# Patient Record
Sex: Male | Born: 1980 | Race: White | Hispanic: No | Marital: Married | Smoking: Never smoker
Health system: Southern US, Community
[De-identification: ages and names within clinical notes are randomized; demographics above are authoritative.]

## PROBLEM LIST (undated history)

## (undated) DIAGNOSIS — F909 Attention-deficit hyperactivity disorder, unspecified type: Secondary | ICD-10-CM

## (undated) DIAGNOSIS — N200 Calculus of kidney: Secondary | ICD-10-CM

## (undated) DIAGNOSIS — M48061 Spinal stenosis, lumbar region without neurogenic claudication: Secondary | ICD-10-CM

## (undated) HISTORY — PX: NO PAST SURGERIES: SHX2092

## (undated) HISTORY — DX: Calculus of kidney: N20.0

## (undated) HISTORY — DX: Attention-deficit hyperactivity disorder, unspecified type: F90.9

## (undated) HISTORY — DX: Spinal stenosis, lumbar region without neurogenic claudication: M48.061

---

## 2006-05-31 ENCOUNTER — Ambulatory Visit: Admission: RE | Admit: 2006-05-31 | Discharge: 2006-07-08 | Payer: Self-pay | Admitting: Radiation Oncology

## 2007-10-01 ENCOUNTER — Ambulatory Visit (HOSPITAL_COMMUNITY): Admission: RE | Admit: 2007-10-01 | Discharge: 2007-10-01 | Payer: Self-pay | Admitting: Family Medicine

## 2009-05-15 ENCOUNTER — Emergency Department (HOSPITAL_COMMUNITY): Admission: EM | Admit: 2009-05-15 | Discharge: 2009-05-15 | Payer: Self-pay | Admitting: Emergency Medicine

## 2009-09-10 ENCOUNTER — Emergency Department (HOSPITAL_COMMUNITY): Admission: EM | Admit: 2009-09-10 | Discharge: 2009-09-10 | Payer: Self-pay | Admitting: Emergency Medicine

## 2011-03-01 LAB — URINALYSIS, ROUTINE W REFLEX MICROSCOPIC
Bilirubin Urine: NEGATIVE
Glucose, UA: NEGATIVE mg/dL
Ketones, ur: NEGATIVE mg/dL
Protein, ur: 30 mg/dL — AB
Specific Gravity, Urine: 1.023 (ref 1.005–1.030)
pH: 5 (ref 5.0–8.0)

## 2011-03-01 LAB — POCT I-STAT, CHEM 8
BUN: 9 mg/dL (ref 6–23)
Calcium, Ion: 1.16 mmol/L (ref 1.12–1.32)
HCT: 46 % (ref 39.0–52.0)
Hemoglobin: 15.6 g/dL (ref 13.0–17.0)
Potassium: 3.5 mEq/L (ref 3.5–5.1)
TCO2: 24 mmol/L (ref 0–100)

## 2011-03-01 LAB — CBC
HCT: 45.1 % (ref 39.0–52.0)
MCHC: 34.1 g/dL (ref 30.0–36.0)
MCV: 86.1 fL (ref 78.0–100.0)
Platelets: 207 10*3/uL (ref 150–400)
WBC: 14.2 10*3/uL — ABNORMAL HIGH (ref 4.0–10.5)

## 2011-03-01 LAB — URINE MICROSCOPIC-ADD ON

## 2011-03-01 LAB — DIFFERENTIAL
Monocytes Absolute: 0.7 10*3/uL (ref 0.1–1.0)
Neutro Abs: 11.7 10*3/uL — ABNORMAL HIGH (ref 1.7–7.7)
Neutrophils Relative %: 83 % — ABNORMAL HIGH (ref 43–77)

## 2011-03-05 LAB — POCT RAPID STREP A (OFFICE): Streptococcus, Group A Screen (Direct): NEGATIVE

## 2011-05-31 ENCOUNTER — Other Ambulatory Visit: Payer: Self-pay | Admitting: Gastroenterology

## 2011-06-04 ENCOUNTER — Inpatient Hospital Stay
Admission: RE | Admit: 2011-06-04 | Discharge: 2011-06-04 | Payer: Self-pay | Source: Ambulatory Visit | Attending: Gastroenterology | Admitting: Gastroenterology

## 2014-09-02 ENCOUNTER — Ambulatory Visit (HOSPITAL_COMMUNITY): Payer: Self-pay | Admitting: Psychiatry

## 2016-11-01 ENCOUNTER — Institutional Professional Consult (permissible substitution): Payer: BLUE CROSS/BLUE SHIELD | Admitting: Neurology

## 2016-11-01 ENCOUNTER — Encounter: Payer: Self-pay | Admitting: Neurology

## 2016-12-04 ENCOUNTER — Institutional Professional Consult (permissible substitution): Payer: BLUE CROSS/BLUE SHIELD | Admitting: Neurology

## 2016-12-04 ENCOUNTER — Telehealth: Payer: Self-pay

## 2016-12-04 NOTE — Telephone Encounter (Signed)
LM for patient to call back and r/s, provider out sick.

## 2017-02-25 ENCOUNTER — Emergency Department (HOSPITAL_COMMUNITY): Payer: Worker's Compensation

## 2017-02-25 ENCOUNTER — Encounter (HOSPITAL_COMMUNITY): Payer: Self-pay | Admitting: Emergency Medicine

## 2017-02-25 ENCOUNTER — Emergency Department (HOSPITAL_COMMUNITY)
Admission: EM | Admit: 2017-02-25 | Discharge: 2017-02-25 | Disposition: A | Discharge status: Home / Self Care | Payer: Worker's Compensation | Attending: Emergency Medicine | Admitting: Emergency Medicine

## 2017-02-25 DIAGNOSIS — T2162XA Corrosion of second degree of abdominal wall, initial encounter: Secondary | ICD-10-CM | POA: Diagnosis not present

## 2017-02-25 DIAGNOSIS — T22612A Corrosion of second degree of left forearm, initial encounter: Secondary | ICD-10-CM | POA: Diagnosis not present

## 2017-02-25 DIAGNOSIS — T542X1A Toxic effect of corrosive acids and acid-like substances, accidental (unintentional), initial encounter: Secondary | ICD-10-CM | POA: Insufficient documentation

## 2017-02-25 DIAGNOSIS — Y929 Unspecified place or not applicable: Secondary | ICD-10-CM | POA: Diagnosis not present

## 2017-02-25 DIAGNOSIS — T22611A Corrosion of second degree of right forearm, initial encounter: Secondary | ICD-10-CM | POA: Insufficient documentation

## 2017-02-25 DIAGNOSIS — Y9389 Activity, other specified: Secondary | ICD-10-CM | POA: Diagnosis not present

## 2017-02-25 DIAGNOSIS — T304 Corrosion of unspecified body region, unspecified degree: Secondary | ICD-10-CM

## 2017-02-25 DIAGNOSIS — F909 Attention-deficit hyperactivity disorder, unspecified type: Secondary | ICD-10-CM | POA: Diagnosis not present

## 2017-02-25 DIAGNOSIS — X58XXXA Exposure to other specified factors, initial encounter: Secondary | ICD-10-CM | POA: Diagnosis not present

## 2017-02-25 DIAGNOSIS — T2142XA Corrosion of unspecified degree of abdominal wall, initial encounter: Secondary | ICD-10-CM | POA: Diagnosis present

## 2017-02-25 DIAGNOSIS — Y990 Civilian activity done for income or pay: Secondary | ICD-10-CM | POA: Diagnosis not present

## 2017-02-25 DIAGNOSIS — T23602A Corrosion of second degree of left hand, unspecified site, initial encounter: Secondary | ICD-10-CM | POA: Insufficient documentation

## 2017-02-25 DIAGNOSIS — T2681XA Corrosions of other specified parts of right eye and adnexa, initial encounter: Secondary | ICD-10-CM | POA: Insufficient documentation

## 2017-02-25 DIAGNOSIS — T23601A Corrosion of second degree of right hand, unspecified site, initial encounter: Secondary | ICD-10-CM | POA: Diagnosis not present

## 2017-02-25 MED ORDER — ALBUTEROL SULFATE HFA 108 (90 BASE) MCG/ACT IN AERS: 1.0000 | INHALATION_SPRAY | Freq: Four times a day (QID) | RESPIRATORY_TRACT | 0 refills | Status: DC | PRN
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
## 2017-02-25 NOTE — ED Notes (Signed)
Respiratory called for oxygen therapy.
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
## 2017-02-25 NOTE — ED Notes (Signed)
Patient transported to X-ray 

## 2017-02-25 NOTE — ED Notes (Signed)
Poison control recommends ABG, chest x-ray, 100% humidified oxygen.  Beta-2 adrenergic agonist PRN for bronchospasm.

## 2017-02-25 NOTE — ED Notes (Signed)
Pt ambulated around ED on room air and stats stayed at 98 percent through the duration of the walk. Pt handled the walk well.

## 2017-05-24 ENCOUNTER — Encounter (HOSPITAL_COMMUNITY): Payer: Self-pay

## 2017-05-24 ENCOUNTER — Emergency Department (HOSPITAL_COMMUNITY)
Admission: EM | Admit: 2017-05-24 | Discharge: 2017-05-25 | Disposition: A | Discharge status: Home / Self Care | Payer: Self-pay | Attending: Emergency Medicine | Admitting: Emergency Medicine

## 2017-05-24 DIAGNOSIS — S61012A Laceration without foreign body of left thumb without damage to nail, initial encounter: Secondary | ICD-10-CM | POA: Insufficient documentation

## 2017-05-24 DIAGNOSIS — W260XXA Contact with knife, initial encounter: Secondary | ICD-10-CM | POA: Insufficient documentation

## 2017-05-24 DIAGNOSIS — Z79899 Other long term (current) drug therapy: Secondary | ICD-10-CM | POA: Insufficient documentation

## 2017-05-24 DIAGNOSIS — Y999 Unspecified external cause status: Secondary | ICD-10-CM | POA: Insufficient documentation

## 2017-05-24 DIAGNOSIS — Y93G1 Activity, food preparation and clean up: Secondary | ICD-10-CM | POA: Insufficient documentation

## 2017-05-24 DIAGNOSIS — Y929 Unspecified place or not applicable: Secondary | ICD-10-CM | POA: Insufficient documentation

## 2017-05-24 NOTE — ED Triage Notes (Signed)
Pt was making pickles tonight and cut  His L thumb with a mandolin. A&Ox4. Would wrapped in triage, but still bleeding. No blooding thinners.

## 2017-05-25 MED ORDER — CEPHALEXIN 500 MG PO CAPS: 500.0000 mg | ORAL_CAPSULE | Freq: Four times a day (QID) | ORAL | 0 refills | Status: DC

## 2017-05-25 NOTE — Discharge Instructions (Signed)
1. Medications: Tylenol or ibuprofen for pain, usual home medications 2. Treatment: ice for swelling, keep wound clean with warm soap and water and keep bandage dry, do not submerge in water for 24 hours 3. Follow Up: Return to the emergency department for increased redness, drainage of pus from the wound   WOUND CARE  Keep area clean and dry for 24 hours. remove bandage in the AM or sooner if you develop pain to the thumb  After 24 hours, wash wound gently with mild soap and warm water. Reapply a new bandage after cleaning wound, if directed.   Continue daily cleansing with soap and water until healed.  Do not apply any ointments or creams to the wound, as this may cause delayed healing. Return if you experience any of the following signs of infection: Swelling, redness, pus drainage, streaking, fever >101.0 F  Return if you experience excessive bleeding that does not stop after 15-20 minutes of constant, firm pressure.

## 2017-05-25 NOTE — ED Provider Notes (Signed)
[facility]-EMERGENCY DEPT Provider Note   CSN: [id number] Arrival date & time: 05/24/17  2228     History   Chief Complaint Chief Complaint  Patient presents with  . Laceration    HPI James Waters is a 36 y.o. male presents to the Emergency Department complaining of acute, persistent laceration to the left thumb.  Pt is left handed and was using a mandolin around 7pm when the laceration occurred.  Pt's tetanus is UTD in the last 5 years.  No immunocompromise or hx of diabetes.  Pt with associated pain at the site.  Pressure applied PTA with hemostasis.    The history is provided by the patient and medical records. No language interpreter was used.    Past Medical History:  Diagnosis Date  . ADHD    adderall  . Lumbar stenosis   . Nephrolithiasis     There are no active problems to display for this patient.   Past Surgical History:  Procedure Laterality Date  . NO PAST SURGERIES         Home Medications    Prior to Admission medications   Medication Sig Start Date End Date Taking? Authorizing Provider  albuterol (PROVENTIL HFA;VENTOLIN HFA) 108 (90 Base) MCG/ACT inhaler Inhale 1-2 puffs into the lungs every 6 (six) hours as needed for wheezing or shortness of breath. 02/25/17   Liberty HandyGibbons, Claudia J, PA-C  cephALEXin (KEFLEX) 500 MG capsule Take 1 capsule (500 mg total) by mouth 4 (four) times daily. 05/25/17   Danaija Eskridge, Dahlia ClientHannah, PA-C  naproxen sodium (ANAPROX) 220 MG tablet Take 220 mg by mouth 2 (two) times daily with a meal.    [provider]    Family History Family History  Problem Relation Age of Onset  . Breast cancer Mother   . Thyroid cancer Mother   . Asthma Sister   . COPD Sister   . Kidney Stones Sister     Social History Social History  Substance Use Topics  . Smoking status: Never Smoker  . Smokeless tobacco: Never Used  . Alcohol use No     Allergies   Oxycodone and Percocet [oxycodone-acetaminophen]   Review of  Systems Review of Systems  Constitutional: Negative for fever.  Gastrointestinal: Negative for nausea and vomiting.  Skin: Positive for wound.  Allergic/Immunologic: Negative for immunocompromised state.  Neurological: Negative for weakness and numbness.  Hematological: Does not bruise/bleed easily.  Psychiatric/Behavioral: The patient is not nervous/anxious.      Physical Exam Updated Vital Signs BP (!) 142/89 (BP Location: Left Arm)   Pulse 74   Temp 99.2 F (37.3 C) (Oral)   Resp 18   SpO2 99%   Physical Exam  Constitutional: He appears well-developed and well-nourished. No distress.  HENT:  Head: Normocephalic and atraumatic.  Eyes: Conjunctivae are normal. No scleral icterus.  Neck: Normal range of motion.  Cardiovascular: Normal rate, regular rhythm, normal heart sounds and intact distal pulses.   No murmur heard. Capillary refill < 3 sec  Pulmonary/Chest: Effort normal and breath sounds normal. No respiratory distress.  Musculoskeletal: Normal range of motion. He exhibits no edema.  FROM of the left thumb  Neurological: He is alert.  Sensation: intact to normal touch throughout the thumb  Skin: Skin is warm and dry. He is not diaphoretic.  Superficial amputation of the radial tip of the pad of the thumb without involvement of the nail  Psychiatric: He has a normal mood and affect.  Nursing note and vitals reviewed.  ED Treatments / Results   Procedures Procedures (including critical care time)  Medications Ordered in ED Medications - No data to display   Initial Impression / Assessment and Plan / ED Course  I have reviewed the triage vital signs and the nursing notes.  Pertinent labs & imaging results that were available during my care of the patient were reviewed by me and considered in my medical decision making (see chart for details).      Pressure irrigation performed. Wound explored and base of wound visualized in a bloodless field without  evidence of foreign body.  Laceration occurred < 8 hours prior to repair which was well tolerated. Tdap up to date.  Pt has no comorbidities to effect normal wound healing. Pt discharged with antibiotics; keflex.  Wound is not suturable.  Pt to follow-up for wound check in 7 days; they are to return to the ED sooner for signs of infection. Pt is hemodynamically stable with no complaints prior to dc.    Final Clinical Impressions(s) / ED Diagnoses   Final diagnoses:  Laceration of left thumb without foreign body without damage to nail, initial encounter    New Prescriptions New Prescriptions   CEPHALEXIN (KEFLEX) 500 MG CAPSULE    Take 1 capsule (500 mg total) by mouth 4 (four) times daily.     Tascha Casares, Boyd Kerbs 05/25/17 0038    Dione Booze, MD 05/25/17 [phone]

## 2018-05-12 DIAGNOSIS — M545 Low back pain: Secondary | ICD-10-CM | POA: Diagnosis not present

## 2018-05-12 DIAGNOSIS — R509 Fever, unspecified: Secondary | ICD-10-CM | POA: Diagnosis not present

## 2018-05-12 DIAGNOSIS — A084 Viral intestinal infection, unspecified: Secondary | ICD-10-CM | POA: Diagnosis not present

## 2018-10-13 DIAGNOSIS — Z0000 Encounter for general adult medical examination without abnormal findings: Secondary | ICD-10-CM | POA: Diagnosis not present

## 2018-10-13 DIAGNOSIS — R82998 Other abnormal findings in urine: Secondary | ICD-10-CM | POA: Diagnosis not present

## 2018-10-20 DIAGNOSIS — Z0000 Encounter for general adult medical examination without abnormal findings: Secondary | ICD-10-CM | POA: Diagnosis not present

## 2018-10-20 DIAGNOSIS — Z1389 Encounter for screening for other disorder: Secondary | ICD-10-CM | POA: Diagnosis not present

## 2019-02-02 DIAGNOSIS — M25511 Pain in right shoulder: Secondary | ICD-10-CM | POA: Diagnosis not present

## 2019-02-02 DIAGNOSIS — Z6832 Body mass index (BMI) 32.0-32.9, adult: Secondary | ICD-10-CM | POA: Diagnosis not present

## 2019-10-27 ENCOUNTER — Ambulatory Visit: Payer: 59 | Admitting: Adult Health

## 2019-10-27 ENCOUNTER — Encounter: Payer: Self-pay | Admitting: Adult Health

## 2019-10-27 ENCOUNTER — Ambulatory Visit (INDEPENDENT_AMBULATORY_CARE_PROVIDER_SITE_OTHER): Payer: 59 | Admitting: Adult Health

## 2019-10-27 ENCOUNTER — Other Ambulatory Visit: Payer: Self-pay

## 2019-10-27 DIAGNOSIS — F411 Generalized anxiety disorder: Secondary | ICD-10-CM

## 2019-10-27 DIAGNOSIS — F909 Attention-deficit hyperactivity disorder, unspecified type: Secondary | ICD-10-CM | POA: Diagnosis not present

## 2019-10-27 DIAGNOSIS — F331 Major depressive disorder, recurrent, moderate: Secondary | ICD-10-CM | POA: Diagnosis not present

## 2019-10-27 DIAGNOSIS — G4700 Insomnia, unspecified: Secondary | ICD-10-CM | POA: Diagnosis not present

## 2019-10-27 MED ORDER — FLUOXETINE HCL 20 MG PO CAPS: 20.0000 mg | ORAL_CAPSULE | Freq: Every day | ORAL | 2 refills | Status: DC

## 2019-10-27 NOTE — Progress Notes (Signed)
[facility] MD/PA/NP Initial Note  10/27/2019 3:51 PM James Waters  MRN:  [id number]  Chief Complaint:   HPI:   Describes mood today as "so-so". Pleasant. Flat. Mood symptoms - reports depression, anxiety, and irritability. Stating "I've been this way my whole life". Has had periods of feeling ok, those times have shortened, and not won't leave. Wife concerned. Stating "I don't know how or why I have it". Feels like he is either "sad or the world is ending". Anxious at times - sitting in truck at work and not going inside. Stating "anxiety comes in waves". Getting irritable and will "shut down". Not yelling or screaming. Worried children are starting to pick up on things - "I don't want that for them". Has thought about therapy. Denies mania. Denies substance Denies infidelity. Some history of impulsivity - not spending - more frugal. Has taken medication in the past - has not not been good at "sticking" with them. Stable interest and motivation. Taking medications as prescribed.  Energy levels lower - "don't have a lot". Active, has a regular exercise routine - on his feet most of the day. Works as a Forensic scientist.  Enjoys some usual interests and activities. Married x 14 years. Has 4 children - 46- 2 sons, 1 d 64, and 1 d 6. Children home schooled. Wife staying home with children. Spending time with family. Appetite adequate. Weight stable - 40 pound gain over past year and a half. Sleeping difficulties "for as long as I can remember". Averages 4 to 5 hours. Falls asleep easily, but is up and down during the night - chronic issues. Focus and concentration varies - "depends on the day". Has taken Adderall in the past - did not work well. Took Ritalin in high school. Completing tasks. Managing aspects of household. Work going well.  Denies SI or HI. Denies AH or VH.  Past medications: Adderall, Ritalin, Paxil, Lexapro, Klonopin, Wellbutrin, Lamictal, Lithium  Visit Diagnosis:    ICD-10-CM    1. Attention deficit hyperactivity disorder (ADHD), unspecified ADHD type  F90.9   2. Major depressive disorder, recurrent episode, moderate (HCC)  F33.1 FLUoxetine (PROZAC) 20 MG capsule  3. Generalized anxiety disorder  F41.1 FLUoxetine (PROZAC) 20 MG capsule  4. Insomnia, unspecified type  G47.00     Past Psychiatric History: Denies psychiatric hospitalizations.   Past Medical History:  Past Medical History:  Diagnosis Date  . ADHD    adderall  . Lumbar stenosis   . Nephrolithiasis     Past Surgical History:  Procedure Laterality Date  . NO PAST SURGERIES      Family Psychiatric History: No pertinent family psychiatric history.   Family History:  Family History  Problem Relation Age of Onset  . Breast cancer Mother   . Thyroid cancer Mother   . Asthma Sister   . COPD Sister   . Kidney Stones Sister     Social History:  Social History   Socioeconomic History  . Marital status: Married    Spouse name: Not on file  . Number of children: Not on file  . Years of education: Not on file  . Highest education level: Not on file  Occupational History  . Not on file  Social Needs  . Financial resource strain: Not on file  . Food insecurity    Worry: Not on file    Inability: Not on file  . Transportation needs    Medical: Not on file    Non-medical: Not on file  Tobacco Use  . Smoking status: Never Smoker  . Smokeless tobacco: Never Used  Substance and Sexual Activity  . Alcohol use: No  . Drug use: No  . Sexual activity: Not on file  Lifestyle  . Physical activity    Days per week: Not on file    Minutes per session: Not on file  . Stress: Not on file  Relationships  . Social Herbalist on phone: Not on file    Gets together: Not on file    Attends religious service: Not on file    Active member of club or organization: Not on file    Attends meetings of clubs or organizations: Not on file    Relationship status: Not on file  Other Topics  Concern  . Not on file  Social History Narrative  . Not on file    Allergies:  Allergies  Allergen Reactions  . Oxycodone Hives  . Percocet [Oxycodone-Acetaminophen] Nausea And Vomiting    Metabolic Disorder Labs: No results found for: HGBA1C, MPG No results found for: PROLACTIN No results found for: CHOL, TRIG, HDL, CHOLHDL, VLDL, LDLCALC No results found for: TSH  Therapeutic Level Labs: No results found for: LITHIUM No results found for: VALPROATE No components found for:  CBMZ  Current Medications: Current Outpatient Medications  Medication Sig Dispense Refill  . FLUoxetine (PROZAC) 20 MG capsule Take 1 capsule (20 mg total) by mouth daily. 30 capsule 2   No current facility-administered medications for this visit.     Medication Side Effects: none  Orders placed this visit:  No orders of the defined types were placed in this encounter.   Psychiatric Specialty Exam:  ROS  There were no vitals taken for this visit.There is no height or weight on file to calculate BMI.  General Appearance: Neat and Well Groomed  Eye Contact:  Good  Speech:  Clear and Coherent  Volume:  Normal  Mood:  Anxious, Depressed and Irritable  Affect:  Congruent  Thought Process:  Coherent, Goal Directed and Descriptions of Associations: Intact  Orientation:  Full (Time, Place, and Person)  Thought Content: Logical   Suicidal Thoughts:  No  Homicidal Thoughts:  No  Memory:  WNL  Judgement:  Good  Insight:  Good  Psychomotor Activity:  Normal  Concentration:  Concentration: Good  Recall:  Good  Fund of Knowledge: Good  Language: Good  Assets:  Communication Skills Desire for Improvement Financial Resources/Insurance Housing Intimacy Leisure Time Physical Health Resilience Social Support Talents/Skills Transportation Vocational/Educational  ADL's:  Intact  Cognition: WNL  Prognosis:  Good   Screenings: None  Receiving Psychotherapy: No Has talked with therapist in  the past. Marriage counseling.   Treatment Plan/Recommendations:  Plan:  1. Add Prozac 20mg  daily.   RTC 4 weeks  Patient advised to contact office with any questions, adverse effects, or acute worsening in signs and symptoms.   Aloha Gell, NP

## 2019-10-28 ENCOUNTER — Encounter: Payer: Self-pay | Admitting: Adult Health

## 2019-11-24 ENCOUNTER — Encounter: Payer: Self-pay | Admitting: Adult Health

## 2019-11-24 ENCOUNTER — Ambulatory Visit (INDEPENDENT_AMBULATORY_CARE_PROVIDER_SITE_OTHER): Payer: 59 | Admitting: Adult Health

## 2019-11-24 ENCOUNTER — Other Ambulatory Visit: Payer: Self-pay

## 2019-11-24 DIAGNOSIS — F909 Attention-deficit hyperactivity disorder, unspecified type: Secondary | ICD-10-CM | POA: Diagnosis not present

## 2019-11-24 DIAGNOSIS — G4700 Insomnia, unspecified: Secondary | ICD-10-CM | POA: Diagnosis not present

## 2019-11-24 DIAGNOSIS — F411 Generalized anxiety disorder: Secondary | ICD-10-CM

## 2019-11-24 DIAGNOSIS — F331 Major depressive disorder, recurrent, moderate: Secondary | ICD-10-CM

## 2019-11-24 DIAGNOSIS — F429 Obsessive-compulsive disorder, unspecified: Secondary | ICD-10-CM

## 2019-11-24 NOTE — Progress Notes (Signed)
James Waters [id number] 23-Dec-1980 38 y.o.  Subjective:   Patient ID:  James Waters is a 38 y.o. (DOB 1981/02/12) male.  Chief Complaint: No chief complaint on file.   HPI James Waters presents to the office today for follow-up of ADHD, MDD, GAD, and insomnia.    HPI:   Describes mood today as "ok". Pleasant. Flat. Mood symptoms - reports decreased depression, anxiety, and irritability.  Reports the "jitters" a few times a day, mostly in the evening". Has cut down on caffeine. Stating "I feel much better". Also stating "I'm happy with the benefits of the medication".  Reports wife has noticed a "difference". Stating "this is the first time "I've enjoyed Christmas in a long time". Feels like he has turned the corner. Reporting sexual side effects - "may be in my head". Stable interest and motivation. Taking medications as prescribed.  Energy levels still low. Active, does not have a regular exercise routine. Works as a Forensic scientist.  Enjoys some usual interests and activities. Married. Lives with wife of 14 years an their 4 children - 72- 2 sons, 1 d 58, and 1 d 6. Children home schooled. Went to the [employer] over Christmas holiday. Spending time with family. Appetite adequate. Weight loss 8 pounds. Sleeping difficulties "it's up and down". Trouble getting to sleep - but staying asleep. Averages 5 hours of solid sleep.  Focus and concentration improved. Completing tasks. Managing aspects of household. Work going well.  Denies SI or HI. Denies AH or VH.  Past medications: Adderall, Ritalin, Paxil, Lexapro, Klonopin, Wellbutrin, Lamictal, Lithium  Review of Systems:  Review of Systems  Musculoskeletal: Negative for gait problem.  Neurological: Negative for tremors.  Psychiatric/Behavioral:       Please refer to HPI    Medications: I have reviewed the patient's current medications.  Current Outpatient Medications  Medication Sig Dispense Refill  .  FLUoxetine (PROZAC) 20 MG capsule Take 1 capsule (20 mg total) by mouth daily. 30 capsule 2   No current facility-administered medications for this visit.    Medication Side Effects: None  Allergies:  Allergies  Allergen Reactions  . Oxycodone Hives  . Percocet [Oxycodone-Acetaminophen] Nausea And Vomiting    Past Medical History:  Diagnosis Date  . ADHD    adderall  . Lumbar stenosis   . Nephrolithiasis     Family History  Problem Relation Age of Onset  . Breast cancer Mother   . Thyroid cancer Mother   . Asthma Sister   . COPD Sister   . Kidney Stones Sister     Social History   Socioeconomic History  . Marital status: Married    Spouse name: Not on file  . Number of children: Not on file  . Years of education: Not on file  . Highest education level: Not on file  Occupational History  . Not on file  Tobacco Use  . Smoking status: Never Smoker  . Smokeless tobacco: Never Used  Substance and Sexual Activity  . Alcohol use: No  . Drug use: No  . Sexual activity: Not on file  Other Topics Concern  . Not on file  Social History Narrative  . Not on file   Social Determinants of Health   Financial Resource Strain:   . Difficulty of Paying Living Expenses: Not on file  Food Insecurity:   . Worried About Programme researcher, broadcasting/film/video in the Last Year: Not on file  . Ran Out of Food in the  Last Year: Not on file  Transportation Needs:   . Lack of Transportation (Medical): Not on file  . Lack of Transportation (Non-Medical): Not on file  Physical Activity:   . Days of Exercise per Week: Not on file  . Minutes of Exercise per Session: Not on file  Stress:   . Feeling of Stress : Not on file  Social Connections:   . Frequency of Communication with Friends and Family: Not on file  . Frequency of Social Gatherings with Friends and Family: Not on file  . Attends Religious Services: Not on file  . Active Member of Clubs or Organizations: Not on file  . Attends Tax inspectorClub or  Organization Meetings: Not on file  . Marital Status: Not on file  Intimate Partner Violence:   . Fear of Current or Ex-Partner: Not on file  . Emotionally Abused: Not on file  . Physically Abused: Not on file  . Sexually Abused: Not on file    Past Medical History, Surgical history, Social history, and Family history were reviewed and updated as appropriate.   Please see review of systems for further details on the patient's review from today.   Objective:   Physical Exam:  There were no vitals taken for this visit.  Physical Exam Constitutional:      General: He is not in acute distress.    Appearance: He is well-developed.  Musculoskeletal:        General: No deformity.  Neurological:     Mental Status: He is alert and oriented to person, place, and time.     Coordination: Coordination normal.  Psychiatric:        Attention and Perception: Attention and perception normal. He does not perceive auditory or visual hallucinations.        Mood and Affect: Mood is anxious and depressed. Affect is not labile, blunt, angry or inappropriate.        Speech: Speech normal.        Behavior: Behavior normal.        Thought Content: Thought content normal. Thought content is not paranoid or delusional. Thought content does not include homicidal or suicidal ideation. Thought content does not include homicidal or suicidal plan.        Cognition and Memory: Cognition and memory normal.        Judgment: Judgment normal.     Comments: Insight intact     Lab Review:     Component Value Date/Time   NA 141 09/10/2009 1948   K 3.5 09/10/2009 1948   CL 106 09/10/2009 1948   GLUCOSE 99 09/10/2009 1948   BUN 9 09/10/2009 1948   CREATININE 0.8 09/10/2009 1948       Component Value Date/Time   WBC 14.2 (H) 09/10/2009 1945   RBC 5.24 09/10/2009 1945   HGB 15.6 09/10/2009 1948   HCT 46.0 09/10/2009 1948   PLT 207 09/10/2009 1945   MCV 86.1 09/10/2009 1945   MCHC 34.1 09/10/2009 1945    RDW 13.2 09/10/2009 1945   LYMPHSABS 1.6 09/10/2009 1945   MONOABS 0.7 09/10/2009 1945   EOSABS 0.1 09/10/2009 1945   BASOSABS 0.0 09/10/2009 1945    No results found for: POCLITH, LITHIUM   No results found for: PHENYTOIN, PHENOBARB, VALPROATE, CBMZ   .res Assessment: Plan:    Plan:  1. Continue Prozac 20mg  daily.   Consider Wellbutrin or Adderall  RTC 4 weeks  Patient advised to contact office with any questions, adverse effects, or acute  worsening in signs and symptoms.  Diagnoses and all orders for this visit:  Attention deficit hyperactivity disorder (ADHD), unspecified ADHD type  Major depressive disorder, recurrent episode, moderate (HCC)  Generalized anxiety disorder  Insomnia, unspecified type     Please see After Visit Summary for patient specific instructions.  No future appointments.  No orders of the defined types were placed in this encounter.   -------------------------------

## 2019-12-03 ENCOUNTER — Ambulatory Visit: Payer: 59 | Attending: Internal Medicine

## 2019-12-03 DIAGNOSIS — Z20822 Contact with and (suspected) exposure to covid-19: Secondary | ICD-10-CM

## 2019-12-05 LAB — NOVEL CORONAVIRUS, NAA: SARS-CoV-2, NAA: NOT DETECTED

## 2019-12-22 ENCOUNTER — Ambulatory Visit (INDEPENDENT_AMBULATORY_CARE_PROVIDER_SITE_OTHER): Payer: 59 | Admitting: Adult Health

## 2019-12-22 ENCOUNTER — Encounter: Payer: Self-pay | Admitting: Adult Health

## 2019-12-22 ENCOUNTER — Other Ambulatory Visit: Payer: Self-pay

## 2019-12-22 VITALS — BP 144/95 | HR 83

## 2019-12-22 DIAGNOSIS — G4700 Insomnia, unspecified: Secondary | ICD-10-CM | POA: Diagnosis not present

## 2019-12-22 DIAGNOSIS — F909 Attention-deficit hyperactivity disorder, unspecified type: Secondary | ICD-10-CM

## 2019-12-22 DIAGNOSIS — F411 Generalized anxiety disorder: Secondary | ICD-10-CM

## 2019-12-22 DIAGNOSIS — F429 Obsessive-compulsive disorder, unspecified: Secondary | ICD-10-CM | POA: Diagnosis not present

## 2019-12-22 DIAGNOSIS — F331 Major depressive disorder, recurrent, moderate: Secondary | ICD-10-CM | POA: Diagnosis not present

## 2019-12-22 MED ORDER — FLUOXETINE HCL 20 MG PO CAPS: 20.0000 mg | ORAL_CAPSULE | Freq: Every day | ORAL | 1 refills | Status: DC

## 2019-12-22 MED ORDER — AMPHETAMINE-DEXTROAMPHETAMINE 20 MG PO TABS: 20.0000 mg | ORAL_TABLET | Freq: Two times a day (BID) | ORAL | 0 refills | Status: DC

## 2019-12-22 NOTE — Progress Notes (Signed)
James Waters [id number] 1981-08-16 39 y.o.  Subjective:   Patient ID:  James Waters is a 39 y.o. (DOB [birth year]) male.  Chief Complaint:  Chief Complaint  Patient presents with  . Anxiety  . Depression  . Insomnia  . ADHD  . Other    OCD    HPI James Waters presents to the office today for follow-up of ADHD, MDD, GAD, OCD, and insomnia.    Describes mood today as "ok". Pleasant. Flat. Mood symptoms - reports decreased depression, anxiety, and irritability.  Stating "I continue to see improvements". Also stating "me and my wife are seeing big differences". Still having hurdles to get over. Has moved Prozac to morning and sleep has improved. Has been trying to educate himself more on his anxiety, ADHD, and OCD.Has been talking to a therapist. Revisited why he "failed" ADD medications - excessive caffeine intake while taking stimulants. Would like to restart Adderall. Reporting sexual side effects - "may be in my head". Stable interest and motivation. Taking medications as prescribed.  Energy levels still low. Active, does not have a regular exercise routine. Works as a Social research officer, government.  Enjoys some usual interests and activities. Married. Lives with wife of 14 years an their 4 children - 55- 2 sons, 1 d 71, and 1 d 60. Wife home schools children. Spending time with family. Appetite adequate. Weight loss 8 pounds. Sleeping difficulties "it's up and down". Trouble getting to sleep - but staying asleep. Averages 5 hours of solid sleep.  Focus and concentration improved. Completing tasks. Managing aspects of household. Work going well.  Denies SI or HI. Denies AH or VH.  Past medications: Adderall, Ritalin, Paxil, Lexapro, Klonopin, Wellbutrin, Lamictal, Lithium  Review of Systems:  Review of Systems  Musculoskeletal: Negative for gait problem.  Neurological: Negative for tremors.  Psychiatric/Behavioral:       Please refer to HPI    Medications: I have reviewed  the patient's current medications.  Current Outpatient Medications  Medication Sig Dispense Refill  . amphetamine-dextroamphetamine (ADDERALL) 20 MG tablet Take 1 tablet (20 mg total) by mouth 2 (two) times daily. 60 tablet 0  . FLUoxetine (PROZAC) 20 MG capsule Take 1 capsule (20 mg total) by mouth daily. 90 capsule 1  . valACYclovir (VALTREX) 1000 MG tablet SMARTSIG:2 Tablet(s) By Mouth Every 12 Hours     No current facility-administered medications for this visit.    Medication Side Effects: None  Allergies:  Allergies  Allergen Reactions  . Oxycodone Hives  . Percocet [Oxycodone-Acetaminophen] Nausea And Vomiting    Past Medical History:  Diagnosis Date  . ADHD    adderall  . Lumbar stenosis   . Nephrolithiasis     Family History  Problem Relation Age of Onset  . Breast cancer Mother   . Thyroid cancer Mother   . Asthma Sister   . COPD Sister   . Kidney Stones Sister     Social History   Socioeconomic History  . Marital status: Married    Spouse name: Not on file  . Number of children: Not on file  . Years of education: Not on file  . Highest education level: Not on file  Occupational History  . Not on file  Tobacco Use  . Smoking status: Never Smoker  . Smokeless tobacco: Never Used  Substance and Sexual Activity  . Alcohol use: No  . Drug use: No  . Sexual activity: Not on file  Other Topics Concern  . Not on  file  Social History Narrative  . Not on file   Social Determinants of Health   Financial Resource Strain:   . Difficulty of Paying Living Expenses: Not on file  Food Insecurity:   . Worried About Programme researcher, broadcasting/film/video in the Last Year: Not on file  . Ran Out of Food in the Last Year: Not on file  Transportation Needs:   . Lack of Transportation (Medical): Not on file  . Lack of Transportation (Non-Medical): Not on file  Physical Activity:   . Days of Exercise per Week: Not on file  . Minutes of Exercise per Session: Not on file   Stress:   . Feeling of Stress : Not on file  Social Connections:   . Frequency of Communication with Friends and Family: Not on file  . Frequency of Social Gatherings with Friends and Family: Not on file  . Attends Religious Services: Not on file  . Active Member of Clubs or Organizations: Not on file  . Attends Banker Meetings: Not on file  . Marital Status: Not on file  Intimate Partner Violence:   . Fear of Current or Ex-Partner: Not on file  . Emotionally Abused: Not on file  . Physically Abused: Not on file  . Sexually Abused: Not on file    Past Medical History, Surgical history, Social history, and Family history were reviewed and updated as appropriate.   Please see review of systems for further details on the patient's review from today.   Objective:   Physical Exam:  BP (!) 144/95   Pulse 83   Physical Exam Constitutional:      General: He is not in acute distress.    Appearance: He is well-developed.  Musculoskeletal:        General: No deformity.  Neurological:     Mental Status: He is alert and oriented to person, place, and time.     Coordination: Coordination normal.  Psychiatric:        Attention and Perception: Attention and perception normal. He does not perceive auditory or visual hallucinations.        Mood and Affect: Mood normal. Mood is not anxious or depressed. Affect is not labile, blunt, angry or inappropriate.        Speech: Speech normal.        Behavior: Behavior normal.        Thought Content: Thought content normal. Thought content is not paranoid or delusional. Thought content does not include homicidal or suicidal ideation. Thought content does not include homicidal or suicidal plan.        Cognition and Memory: Cognition and memory normal.        Judgment: Judgment normal.     Comments: Insight intact     Lab Review:     Component Value Date/Time   NA 141 09/10/2009 1948   K 3.5 09/10/2009 1948   CL 106 09/10/2009  1948   GLUCOSE 99 09/10/2009 1948   BUN 9 09/10/2009 1948   CREATININE 0.8 09/10/2009 1948       Component Value Date/Time   WBC 14.2 (H) 09/10/2009 1945   RBC 5.24 09/10/2009 1945   HGB 15.6 09/10/2009 1948   HCT 46.0 09/10/2009 1948   PLT 207 09/10/2009 1945   MCV 86.1 09/10/2009 1945   MCHC 34.1 09/10/2009 1945   RDW 13.2 09/10/2009 1945   LYMPHSABS 1.6 09/10/2009 1945   MONOABS 0.7 09/10/2009 1945   EOSABS 0.1 09/10/2009 1945  BASOSABS 0.0 09/10/2009 1945    No results found for: POCLITH, LITHIUM   No results found for: PHENYTOIN, PHENOBARB, VALPROATE, CBMZ   .res Assessment: Plan:     Plan:  Continue Prozac 20mg  daily.  Add Adderall 20mg  BID  BP this am before caffeine 132/68 74. Advised to decrease caffeine use while taking the Adderall.   RTC 4 weeks  Patient advised to contact office with any questions, adverse effects, or acute worsening in signs and symptoms.  Andranik was seen today for anxiety, depression, insomnia, adhd and other.  Diagnoses and all orders for this visit:  Obsessive-compulsive disorder, unspecified type  Insomnia, unspecified type  Generalized anxiety disorder -     FLUoxetine (PROZAC) 20 MG capsule; Take 1 capsule (20 mg total) by mouth daily.  Major depressive disorder, recurrent episode, moderate (HCC) -     FLUoxetine (PROZAC) 20 MG capsule; Take 1 capsule (20 mg total) by mouth daily.  Attention deficit hyperactivity disorder (ADHD), unspecified ADHD type -     amphetamine-dextroamphetamine (ADDERALL) 20 MG tablet; Take 1 tablet (20 mg total) by mouth 2 (two) times daily.     Please see After Visit Summary for patient specific instructions.  No future appointments.  No orders of the defined types were placed in this encounter.   -------------------------------

## 2019-12-23 ENCOUNTER — Telehealth: Payer: Self-pay | Admitting: Adult Health

## 2019-12-23 ENCOUNTER — Other Ambulatory Visit: Payer: Self-pay

## 2019-12-23 DIAGNOSIS — F331 Major depressive disorder, recurrent, moderate: Secondary | ICD-10-CM

## 2019-12-23 DIAGNOSIS — F909 Attention-deficit hyperactivity disorder, unspecified type: Secondary | ICD-10-CM

## 2019-12-23 DIAGNOSIS — F411 Generalized anxiety disorder: Secondary | ICD-10-CM

## 2019-12-23 MED ORDER — AMPHETAMINE-DEXTROAMPHETAMINE 20 MG PO TABS: 20.0000 mg | ORAL_TABLET | Freq: Two times a day (BID) | ORAL | 0 refills | Status: DC

## 2019-12-23 NOTE — Telephone Encounter (Signed)
Script sent to [facility].

## 2019-12-23 NOTE — Telephone Encounter (Signed)
James Waters called to report that the [facility] where you sent his Adderall presciption does not have the medication and will not get it until sometime in February.  Do you want him to wait until they get to start the medication or can you send it somewhere else.  He mentioned sending to the [facility] on [address] here by [place], but he doesn't know if they have it or not.

## 2019-12-30 ENCOUNTER — Telehealth: Payer: Self-pay | Admitting: Adult Health

## 2019-12-30 NOTE — Telephone Encounter (Signed)
Pt having side effects from the prozac that he has been taking since the week before Thanksgiving. Pt was suppose to take adderall and prozac together but he has not been able to.

## 2019-12-31 NOTE — Telephone Encounter (Signed)
Spoke with pt and he states that he takes the Prozac and it is giving him sexual side effects. He states he wants to know if he can take the Adderall and Prozac together? He does not want anymore sexual side effects. Will the Adderall help him with these side effects as well? Please advise.

## 2019-12-31 NOTE — Telephone Encounter (Signed)
Please advise 

## 2019-12-31 NOTE — Telephone Encounter (Signed)
Pt. Made aware.

## 2019-12-31 NOTE — Telephone Encounter (Signed)
He can take both. The sexual side effects should get better, if not we can discuss other options at next appointment.

## 2020-01-18 ENCOUNTER — Other Ambulatory Visit: Payer: Self-pay

## 2020-01-18 ENCOUNTER — Telehealth: Payer: Self-pay | Admitting: Adult Health

## 2020-01-18 DIAGNOSIS — F909 Attention-deficit hyperactivity disorder, unspecified type: Secondary | ICD-10-CM

## 2020-01-18 MED ORDER — AMPHETAMINE-DEXTROAMPHETAMINE 20 MG PO TABS: 20.0000 mg | ORAL_TABLET | Freq: Two times a day (BID) | ORAL | 0 refills | Status: DC

## 2020-01-18 NOTE — Telephone Encounter (Signed)
Pt needs refill on ADDERALL 20 MG. Please send to pharmacy on file.

## 2020-01-18 NOTE — Telephone Encounter (Signed)
Last refill 12/23/2019, pended for Rene Kocher to submit

## 2020-01-20 ENCOUNTER — Ambulatory Visit: Payer: 59 | Attending: Internal Medicine

## 2020-01-20 DIAGNOSIS — Z20822 Contact with and (suspected) exposure to covid-19: Secondary | ICD-10-CM

## 2020-01-21 LAB — NOVEL CORONAVIRUS, NAA: SARS-CoV-2, NAA: NOT DETECTED

## 2020-02-06 ENCOUNTER — Ambulatory Visit: Payer: 59

## 2020-02-18 ENCOUNTER — Telehealth: Payer: Self-pay | Admitting: Adult Health

## 2020-02-18 ENCOUNTER — Other Ambulatory Visit: Payer: Self-pay

## 2020-02-18 DIAGNOSIS — F909 Attention-deficit hyperactivity disorder, unspecified type: Secondary | ICD-10-CM

## 2020-02-18 MED ORDER — AMPHETAMINE-DEXTROAMPHETAMINE 20 MG PO TABS: 20.0000 mg | ORAL_TABLET | Freq: Two times a day (BID) | ORAL | 0 refills | Status: DC

## 2020-02-18 NOTE — Telephone Encounter (Signed)
Pt would like a refill on Adderall. Please send to [facility] on [address].

## 2020-02-18 NOTE — Telephone Encounter (Signed)
Last refill 01/18/2020, last apt 11/2019 Pended for Rene Kocher to submit

## 2020-03-24 ENCOUNTER — Telehealth: Payer: Self-pay | Admitting: Student

## 2020-03-24 ENCOUNTER — Other Ambulatory Visit: Payer: Self-pay

## 2020-03-24 DIAGNOSIS — F909 Attention-deficit hyperactivity disorder, unspecified type: Secondary | ICD-10-CM

## 2020-03-24 MED ORDER — AMPHETAMINE-DEXTROAMPHETAMINE 20 MG PO TABS: 20.0000 mg | ORAL_TABLET | Freq: Two times a day (BID) | ORAL | 0 refills | Status: DC

## 2020-03-24 NOTE — Telephone Encounter (Signed)
Last refill 04/05, pended with appropriate fill date for [facility]

## 2020-03-24 NOTE — Telephone Encounter (Signed)
Pt needs refill on ADDERALL 20 MG. Please send to pharmacy on file. Adult nurse, [employer]

## 2020-04-07 ENCOUNTER — Other Ambulatory Visit: Payer: Self-pay

## 2020-04-07 ENCOUNTER — Encounter: Payer: Self-pay | Admitting: Adult Health

## 2020-04-07 ENCOUNTER — Ambulatory Visit (INDEPENDENT_AMBULATORY_CARE_PROVIDER_SITE_OTHER): Payer: 59 | Admitting: Adult Health

## 2020-04-07 DIAGNOSIS — F331 Major depressive disorder, recurrent, moderate: Secondary | ICD-10-CM | POA: Diagnosis not present

## 2020-04-07 DIAGNOSIS — F909 Attention-deficit hyperactivity disorder, unspecified type: Secondary | ICD-10-CM | POA: Diagnosis not present

## 2020-04-07 DIAGNOSIS — F411 Generalized anxiety disorder: Secondary | ICD-10-CM

## 2020-04-07 MED ORDER — AMPHETAMINE-DEXTROAMPHETAMINE 20 MG PO TABS: 20.0000 mg | ORAL_TABLET | Freq: Two times a day (BID) | ORAL | 0 refills | Status: DC

## 2020-04-07 MED ORDER — FLUOXETINE HCL 20 MG PO CAPS: 20.0000 mg | ORAL_CAPSULE | Freq: Every day | ORAL | 1 refills | Status: DC

## 2020-04-07 NOTE — Progress Notes (Signed)
James Waters [id number] 02-19-1981 39 y.o.  Subjective:   Patient ID:  James Waters is a 39 y.o. (DOB 1981-02-01) male.  Chief Complaint: No chief complaint on file.   HPI James Waters presents to the office today for follow-up of ADHD, MDD, GAD, OCD, and insomnia.    Describes mood today as "ok". Pleasant. Flat. Mood symptoms - denies depression, anxiety, and irritability.  Stating "I have good and bad days, more good than bad". Denies sexual sides - taking prozac 20mg  every other day. Recent labs indicated low testosterone. Has been taking supplements and exercising - "that is helping". Stable interest and motivation. Taking medications as prescribed.  Energy levels stable. Active, has a regular exercise routine - 3 to 5 times a week.. Works as a Social research officer, government.  Enjoys some usual interests and activities. Married. Lives with wife of 14 years an their 4 children - 36- 2 sons, 1 d 29, and 1 d 1. Wife home schools children. Spending time with family. Appetite adequate. Weight loss 19 pounds. Sleeping well most nights. Averages 7 to 8 hours. Some disruption with working out of town recently.  Focus and concentration stable. Completing tasks. Managing aspects of household. Work going well.  Denies SI or HI. Denies AH or VH.  Past medications: Adderall, Ritalin, Paxil, Lexapro, Klonopin, Wellbutrin, Lamictal, Lithium     Review of Systems:  Review of Systems  Musculoskeletal: Negative for gait problem.  Neurological: Negative for tremors.  Psychiatric/Behavioral:       Please refer to HPI    Medications: I have reviewed the patient's current medications.  Current Outpatient Medications  Medication Sig Dispense Refill  . amphetamine-dextroamphetamine (ADDERALL) 20 MG tablet Take 1 tablet (20 mg total) by mouth 2 (two) times daily. 60 tablet 0  . [START ON 05/05/2020] amphetamine-dextroamphetamine (ADDERALL) 20 MG tablet Take 1 tablet (20 mg total) by mouth 2  (two) times daily. 60 tablet 0  . [START ON 06/02/2020] amphetamine-dextroamphetamine (ADDERALL) 20 MG tablet Take 1 tablet (20 mg total) by mouth 2 (two) times daily. 60 tablet 0  . FLUoxetine (PROZAC) 20 MG capsule Take 1 capsule (20 mg total) by mouth daily. 90 capsule 1  . valACYclovir (VALTREX) 1000 MG tablet SMARTSIG:2 Tablet(s) By Mouth Every 12 Hours     No current facility-administered medications for this visit.    Medication Side Effects: None  Allergies:  Allergies  Allergen Reactions  . Oxycodone Hives  . Percocet [Oxycodone-Acetaminophen] Nausea And Vomiting    Past Medical History:  Diagnosis Date  . ADHD    adderall  . Lumbar stenosis   . Nephrolithiasis     Family History  Problem Relation Age of Onset  . Breast cancer Mother   . Thyroid cancer Mother   . Asthma Sister   . COPD Sister   . Kidney Stones Sister     Social History   Socioeconomic History  . Marital status: Married    Spouse name: Not on file  . Number of children: Not on file  . Years of education: Not on file  . Highest education level: Not on file  Occupational History  . Not on file  Tobacco Use  . Smoking status: Never Smoker  . Smokeless tobacco: Never Used  Substance and Sexual Activity  . Alcohol use: No  . Drug use: No  . Sexual activity: Not on file  Other Topics Concern  . Not on file  Social History Narrative  . Not on file  Social Determinants of Health   Financial Resource Strain:   . Difficulty of Paying Living Expenses:   Food Insecurity:   . Worried About Programme researcher, broadcasting/film/video in the Last Year:   . Barista in the Last Year:   Transportation Needs:   . Freight forwarder (Medical):   Marland Kitchen Lack of Transportation (Non-Medical):   Physical Activity:   . Days of Exercise per Week:   . Minutes of Exercise per Session:   Stress:   . Feeling of Stress :   Social Connections:   . Frequency of Communication with Friends and Family:   . Frequency of  Social Gatherings with Friends and Family:   . Attends Religious Services:   . Active Member of Clubs or Organizations:   . Attends Banker Meetings:   Marland Kitchen Marital Status:   Intimate Partner Violence:   . Fear of Current or Ex-Partner:   . Emotionally Abused:   Marland Kitchen Physically Abused:   . Sexually Abused:     Past Medical History, Surgical history, Social history, and Family history were reviewed and updated as appropriate.   Please see review of systems for further details on the patient's review from today.   Objective:   Physical Exam:  There were no vitals taken for this visit.  Physical Exam Constitutional:      General: He is not in acute distress. Musculoskeletal:        General: No deformity.  Neurological:     Mental Status: He is alert and oriented to person, place, and time.     Coordination: Coordination normal.  Psychiatric:        Attention and Perception: Attention and perception normal. He does not perceive auditory or visual hallucinations.        Mood and Affect: Mood normal. Mood is not anxious or depressed. Affect is not labile, blunt, angry or inappropriate.        Speech: Speech normal.        Behavior: Behavior normal.        Thought Content: Thought content normal. Thought content is not paranoid or delusional. Thought content does not include homicidal or suicidal ideation. Thought content does not include homicidal or suicidal plan.        Cognition and Memory: Cognition and memory normal.        Judgment: Judgment normal.     Comments: Insight intact     Lab Review:     Component Value Date/Time   NA 141 09/10/2009 1948   K 3.5 09/10/2009 1948   CL 106 09/10/2009 1948   GLUCOSE 99 09/10/2009 1948   BUN 9 09/10/2009 1948   CREATININE 0.8 09/10/2009 1948       Component Value Date/Time   WBC 14.2 (H) 09/10/2009 1945   RBC 5.24 09/10/2009 1945   HGB 15.6 09/10/2009 1948   HCT 46.0 09/10/2009 1948   PLT 207 09/10/2009 1945    MCV 86.1 09/10/2009 1945   MCHC 34.1 09/10/2009 1945   RDW 13.2 09/10/2009 1945   LYMPHSABS 1.6 09/10/2009 1945   MONOABS 0.7 09/10/2009 1945   EOSABS 0.1 09/10/2009 1945   BASOSABS 0.0 09/10/2009 1945    No results found for: POCLITH, LITHIUM   No results found for: PHENYTOIN, PHENOBARB, VALPROATE, CBMZ   .res Assessment: Plan:    Plan:  Prozac 20mg  every other day  Adderall 20mg  BID  RTC 3 months  Patient advised to contact office with any questions,  adverse effects, or acute worsening in signs and symptoms  Discussed potential benefits, risks, and side effects of stimulants with patient to include increased heart rate, palpitations, insomnia, increased anxiety, increased irritability, or decreased appetite.  Instructed patient to contact office if experiencing any significant tolerability issues.  Diagnoses and all orders for this visit:  Generalized anxiety disorder -     FLUoxetine (PROZAC) 20 MG capsule; Take 1 capsule (20 mg total) by mouth daily.  Major depressive disorder, recurrent episode, moderate (HCC) -     FLUoxetine (PROZAC) 20 MG capsule; Take 1 capsule (20 mg total) by mouth daily.  Attention deficit hyperactivity disorder (ADHD), unspecified ADHD type -     amphetamine-dextroamphetamine (ADDERALL) 20 MG tablet; Take 1 tablet (20 mg total) by mouth 2 (two) times daily. -     amphetamine-dextroamphetamine (ADDERALL) 20 MG tablet; Take 1 tablet (20 mg total) by mouth 2 (two) times daily. -     amphetamine-dextroamphetamine (ADDERALL) 20 MG tablet; Take 1 tablet (20 mg total) by mouth 2 (two) times daily.     Please see After Visit Summary for patient specific instructions.  No future appointments.  No orders of the defined types were placed in this encounter.   -------------------------------

## 2020-07-07 ENCOUNTER — Ambulatory Visit: Payer: 59 | Admitting: Adult Health

## 2020-07-15 ENCOUNTER — Other Ambulatory Visit (HOSPITAL_BASED_OUTPATIENT_CLINIC_OR_DEPARTMENT_OTHER): Payer: Self-pay | Admitting: Internal Medicine

## 2020-07-15 ENCOUNTER — Other Ambulatory Visit: Payer: Self-pay

## 2020-07-15 ENCOUNTER — Ambulatory Visit (HOSPITAL_BASED_OUTPATIENT_CLINIC_OR_DEPARTMENT_OTHER)
Admission: RE | Admit: 2020-07-15 | Discharge: 2020-07-15 | Disposition: A | Discharge status: Home / Self Care | Payer: 59 | Source: Ambulatory Visit | Attending: Internal Medicine | Admitting: Internal Medicine

## 2020-07-15 DIAGNOSIS — R319 Hematuria, unspecified: Secondary | ICD-10-CM | POA: Insufficient documentation

## 2020-07-21 ENCOUNTER — Other Ambulatory Visit: Payer: Self-pay | Admitting: Urology

## 2020-07-28 ENCOUNTER — Encounter (HOSPITAL_BASED_OUTPATIENT_CLINIC_OR_DEPARTMENT_OTHER): Admission: RE | Payer: Self-pay | Source: Home / Self Care

## 2020-07-28 ENCOUNTER — Ambulatory Visit (HOSPITAL_BASED_OUTPATIENT_CLINIC_OR_DEPARTMENT_OTHER): Admission: RE | Admit: 2020-07-28 | Payer: 59 | Source: Home / Self Care | Admitting: Urology

## 2020-07-28 SURGERY — LITHOTRIPSY, ESWL
Anesthesia: LOCAL | Laterality: Left

## 2020-09-10 ENCOUNTER — Other Ambulatory Visit: Payer: Self-pay | Admitting: Urology

## 2020-09-10 MED ORDER — TAMSULOSIN HCL 0.4 MG PO CAPS: 0.4000 mg | ORAL_CAPSULE | Freq: Every day | ORAL | 0 refills | Status: AC

## 2020-09-10 MED ORDER — KETOROLAC TROMETHAMINE 10 MG PO TABS: 10.0000 mg | ORAL_TABLET | Freq: Four times a day (QID) | ORAL | 0 refills | Status: AC | PRN

## 2020-10-19 ENCOUNTER — Emergency Department (HOSPITAL_COMMUNITY)
Admission: EM | Admit: 2020-10-19 | Discharge: 2020-10-19 | Disposition: A | Discharge status: Home / Self Care | Payer: 59 | Attending: Emergency Medicine | Admitting: Emergency Medicine

## 2020-10-19 ENCOUNTER — Emergency Department (HOSPITAL_COMMUNITY): Payer: 59

## 2020-10-19 ENCOUNTER — Encounter (HOSPITAL_COMMUNITY): Payer: Self-pay | Admitting: Emergency Medicine

## 2020-10-19 DIAGNOSIS — N200 Calculus of kidney: Secondary | ICD-10-CM | POA: Insufficient documentation

## 2020-10-19 DIAGNOSIS — N201 Calculus of ureter: Secondary | ICD-10-CM

## 2020-10-19 DIAGNOSIS — R109 Unspecified abdominal pain: Secondary | ICD-10-CM | POA: Diagnosis present

## 2020-10-19 LAB — URINALYSIS, ROUTINE W REFLEX MICROSCOPIC
Bilirubin Urine: NEGATIVE
Glucose, UA: NEGATIVE mg/dL
Ketones, ur: NEGATIVE mg/dL
Leukocytes,Ua: NEGATIVE
Nitrite: NEGATIVE
Protein, ur: 30 mg/dL — AB
RBC / HPF: >50 RBC/hpf — ABNORMAL HIGH (ref 0–5)
Specific Gravity, Urine: 1.025 (ref 1.005–1.030)
pH: 5 (ref 5.0–8.0)

## 2020-10-19 LAB — CBC
HCT: 48.4 % (ref 39.0–52.0)
Hemoglobin: 15.6 g/dL (ref 13.0–17.0)
MCH: 27.6 pg (ref 26.0–34.0)
MCHC: 32.2 g/dL (ref 30.0–36.0)
MCV: 85.5 fL (ref 80.0–100.0)
Platelets: 195 10*3/uL (ref 150–400)
RBC: 5.66 MIL/uL (ref 4.22–5.81)
RDW: 13.1 % (ref 11.5–15.5)
WBC: 8.9 10*3/uL (ref 4.0–10.5)
nRBC: 0 % (ref 0.0–0.2)

## 2020-10-19 LAB — COMPREHENSIVE METABOLIC PANEL
ALT: 30 U/L (ref 0–44)
AST: 21 U/L (ref 15–41)
Albumin: 4.1 g/dL (ref 3.5–5.0)
Alkaline Phosphatase: 48 U/L (ref 38–126)
Anion gap: 9 (ref 5–15)
BUN: 16 mg/dL (ref 6–20)
CO2: 25 mmol/L (ref 22–32)
Calcium: 8.6 mg/dL — ABNORMAL LOW (ref 8.9–10.3)
Chloride: 104 mmol/L (ref 98–111)
Creatinine, Ser: 0.92 mg/dL (ref 0.61–1.24)
GFR, Estimated: >60 mL/min (ref >60)
Glucose, Bld: 106 mg/dL — ABNORMAL HIGH (ref 70–99)
Potassium: 3.8 mmol/L (ref 3.5–5.1)
Sodium: 138 mmol/L (ref 135–145)
Total Bilirubin: 0.4 mg/dL (ref 0.3–1.2)
Total Protein: 6.6 g/dL (ref 6.5–8.1)

## 2020-10-19 LAB — LIPASE, BLOOD: Lipase: 34 U/L (ref 11–51)

## 2020-10-19 MED ORDER — ONDANSETRON HCL 4 MG/2ML IJ SOLN
4.0000 mg | Freq: Once | INTRAMUSCULAR | Status: AC
  Administered 2020-10-19: 4 mg via INTRAVENOUS
  Filled 2020-10-19: qty 2

## 2020-10-19 MED ORDER — SENNOSIDES-DOCUSATE SODIUM 8.6-50 MG PO TABS: 1.0000 | ORAL_TABLET | Freq: Every evening | ORAL | 0 refills | Status: DC | PRN

## 2020-10-19 MED ORDER — HYDROCODONE-ACETAMINOPHEN 5-325 MG PO TABS
1.0000 | ORAL_TABLET | Freq: Four times a day (QID) | ORAL | 0 refills | Status: DC | PRN
Start: 2020-10-19 — End: 2021-03-27

## 2020-10-19 MED ORDER — IBUPROFEN 800 MG PO TABS: 800.0000 mg | ORAL_TABLET | Freq: Three times a day (TID) | ORAL | 0 refills | Status: DC | PRN

## 2020-10-19 MED ORDER — TAMSULOSIN HCL 0.4 MG PO CAPS: 0.4000 mg | ORAL_CAPSULE | Freq: Every day | ORAL | 0 refills | Status: AC

## 2020-10-19 MED ORDER — ONDANSETRON HCL 4 MG PO TABS: 4.0000 mg | ORAL_TABLET | Freq: Three times a day (TID) | ORAL | 0 refills | Status: DC | PRN

## 2020-10-19 MED ORDER — KETOROLAC TROMETHAMINE 30 MG/ML IJ SOLN
30.0000 mg | Freq: Once | INTRAMUSCULAR | Status: AC
  Administered 2020-10-19: 30 mg via INTRAVENOUS
  Filled 2020-10-19: qty 1

## 2020-10-19 NOTE — Discharge Instructions (Signed)
You have been seen in the Emergency Department (ED) today for pain that we believe based on your workup, is caused by kidney stones.  As we have discussed, please drink plenty of fluids.  Please make a follow up appointment with the physician(s) listed elsewhere in this documentation.  You may take pain medication as needed but ONLY as prescribed.  Please also take your prescribed Flomax daily.  We also recommend that you take over-the-counter ibuprofen regularly according to label instructions over the next 5 days.  Take it with meals to minimize stomach discomfort.  Please see your doctor as soon as possible as stones may take 1-3 weeks to pass and you may require additional care or medications.  Do not drink alcohol, drive or participate in any other potentially dangerous activities while taking opiate pain medication as it may make you sleepy. Do not take this medication with any other sedating medications, either prescription or over-the-counter. If you were prescribed Vicodin, do not take these with acetaminophen (Tylenol) as it is already contained within these medications.   Take Vicodin as needed for severe pain.  This medication is an opiate (or narcotic) pain medication and can be habit forming.  Use it as little as possible to achieve adequate pain control.  Do not use or use it with extreme caution if you have a history of opiate abuse or dependence.  If you are on a pain contract with your primary care doctor or a pain specialist, be sure to let them know you were prescribed this medication today from the Emergency Department.  This medication is intended for your use only - do not give any to anyone else and keep it in a secure place where nobody else, especially children, have access to it.  It will also cause or worsen constipation, so you may want to consider taking an over-the-counter stool softener while you are taking this medication.  Return to the Emergency Department (ED) or call  your doctor if you have any worsening pain, fever, painful urination, are unable to urinate, or develop other symptoms that concern you.

## 2020-10-19 NOTE — ED Provider Notes (Signed)
Emergency Department Provider Note   I have reviewed the triage vital signs and the nursing notes.   HISTORY  Chief Complaint Flank Pain   HPI James Waters is a 39 y.o. male with past medical history of kidney stones presents to the emergency department with left flank pain returning over the past 24 hours.  He had had intermittent pain with hematuria and had a CT scan in August of this year showing a 5 mm nonobstructing stone on the left.  He notes prior imaging with larger stones and there was an initial discussion regarding lithotripsy.  The OR schedule was delayed and symptoms improved and so patient never underwent that procedure.  He has had kidney stones in the past that have not required intervention.  In the past 24 hours he has had worsening pain which is intermittent with no clear provoking factors.  He has had nausea and vomiting which she attributes to pain as well.  He has been trying to stay hydrated but has had difficulty with urination.  Denies any fevers or chills.  Denies known hematuria today.  No radiation of symptoms or other modifying factors. Followed by Dr. Liliane Shi with [facility].    Past Medical History:  Diagnosis Date  . ADHD    adderall  . Lumbar stenosis   . Nephrolithiasis     There are no problems to display for this patient.   Past Surgical History:  Procedure Laterality Date  . NO PAST SURGERIES      Allergies Oxycodone and Percocet [oxycodone-acetaminophen]  Family History  Problem Relation Age of Onset  . Breast cancer Mother   . Thyroid cancer Mother   . Asthma Sister   . COPD Sister   . Kidney Stones Sister     Social History Social History   Tobacco Use  . Smoking status: Never Smoker  . Smokeless tobacco: Never Used  Substance Use Topics  . Alcohol use: No  . Drug use: No    Review of Systems  Constitutional: No fever/chills Eyes: No visual changes. ENT: No sore throat. Cardiovascular: Denies chest  pain. Respiratory: Denies shortness of breath. Gastrointestinal: Positive left flank/abdominal pain. Positive nausea and vomiting.  No diarrhea.  No constipation. Genitourinary: Negative for dysuria. Positive urine hesitancy.  Musculoskeletal: Negative for back pain. Skin: Negative for rash. Neurological: Negative for headaches, focal weakness or numbness.  10-point ROS otherwise negative.  ____________________________________________   PHYSICAL EXAM:  VITAL SIGNS: ED Triage Vitals  Enc Vitals Group     BP 10/19/20 0130 (!) 164/103     Pulse Rate 10/19/20 0130 78     Resp 10/19/20 0130 18     Temp 10/19/20 0130 98.1 F (36.7 C)     Temp Source 10/19/20 0130 Oral     SpO2 10/19/20 0130 100 %     Weight 10/19/20 0130 195 lb (88.5 kg)     Height 10/19/20 0130 5\' 9"  (1.753 m)   Constitutional: Alert and oriented. Well appearing and in no acute distress. Eyes: Conjunctivae are normal.  Head: Atraumatic. Nose: No congestion/rhinnorhea. Mouth/Throat: Mucous membranes are moist.   Neck: No stridor.   Cardiovascular: Normal rate, regular rhythm. Good peripheral circulation. Grossly normal heart sounds.   Respiratory: Normal respiratory effort.  No retractions. Lungs CTAB. Gastrointestinal: Soft and nontender. No distention.  Musculoskeletal: No lower extremity tenderness nor edema. No gross deformities of extremities. Neurologic:  Normal speech and language. No gross focal neurologic deficits are appreciated.  Skin:  Skin is warm, dry and intact. No rash noted.  ____________________________________________   LABS (all labs ordered are listed, but only abnormal results are displayed)  Labs Reviewed  COMPREHENSIVE METABOLIC PANEL - Abnormal; Notable for the following components:      Result Value   Glucose, Bld 106 (*)    Calcium 8.6 (*)    All other components within normal limits  URINALYSIS, ROUTINE W REFLEX MICROSCOPIC - Abnormal; Notable for the following components:    APPearance HAZY (*)    Hgb urine dipstick LARGE (*)    Protein, ur 30 (*)    RBC / HPF >50 (*)    Bacteria, UA RARE (*)    All other components within normal limits  URINE CULTURE  LIPASE, BLOOD  CBC   ____________________________________________  RADIOLOGY  CT Renal Stone Study  Result Date: 10/19/2020 CLINICAL DATA:  Left flank pain. Previous renal stones. Patient was scheduled for lithotripsy but never had it. EXAM: CT ABDOMEN AND PELVIS WITHOUT CONTRAST TECHNIQUE: Multidetector CT imaging of the abdomen and pelvis was performed following the standard protocol without IV contrast. COMPARISON:  07/15/2020 FINDINGS: Lower chest: The lung bases are clear. Hepatobiliary: No focal liver abnormality is seen. No gallstones, gallbladder wall thickening, or biliary dilatation. Pancreas: Unremarkable. No pancreatic ductal dilatation or surrounding inflammatory changes. Spleen: Normal in size without focal abnormality. Adrenals/Urinary Tract: No adrenal gland nodules. There is a 7 mm stone in the proximal left ureter just distal to the ureteropelvic junction. Since the previous study, the stone has only moved about 2 cm inferiorly. There is proximal left renal hydronephrosis with stranding around the left kidney and ureter. The distal left ureter is decompressed. Additional nonobstructing stones demonstrated in the lower pole left kidney. Bladder is normal. Stomach/Bowel: Stomach is within normal limits. Appendix appears normal. No evidence of bowel wall thickening, distention, or inflammatory changes. Vascular/Lymphatic: No significant vascular findings are present. No enlarged abdominal or pelvic lymph nodes. Reproductive: Prostate is unremarkable. Other: No abdominal wall hernia or abnormality. No abdominopelvic ascites. Musculoskeletal: No acute or significant osseous findings. IMPRESSION: 7 mm stone in the proximal left ureter with moderate proximal obstruction. Additional nonobstructing stones in the  lower pole left kidney. Electronically Signed   By: Burman Nieves M.D.   On: 10/19/2020 04:00    ____________________________________________   PROCEDURES  Procedure(s) performed:   Procedures  None  ____________________________________________   INITIAL IMPRESSION / ASSESSMENT AND PLAN / ED COURSE  Pertinent labs & imaging results that were available during my care of the patient were reviewed by me and considered in my medical decision making (see chart for details).   Patient with history of ureteral stone presents with similar pain over the past 24 hours.  I reviewed the patient's CT imaging from August of this year.  Patient with sudden severe worsening symptoms.  Plan for repeat CT imaging to assess location and size of presumed ureteral stone.  Considered alternate diagnoses such as intra-abdominal infection/surgical process or vascular pathology but with the patient's exam and age she is lower risk for these.  Plan for CT renal, labs, UA, culture, and pain control here.  CT renal reviewed with 7 mm stone with obstruction. No UTI. Pain well controlled here. He is established with Urology. Plans to call in the AM for a follow up appointment. [place] drug database reviewed and Rx for Vicodin given. Discussed ED return precautions.  ____________________________________________  FINAL CLINICAL IMPRESSION(S) / ED DIAGNOSES  Final diagnoses:  Ureteral stone  MEDICATIONS GIVEN DURING THIS VISIT:  Medications  ketorolac (TORADOL) 30 MG/ML injection 30 mg (30 mg Intravenous Given 10/19/20 0211)  ondansetron (ZOFRAN) injection 4 mg (4 mg Intravenous Given 10/19/20 0210)     NEW OUTPATIENT MEDICATIONS STARTED DURING THIS VISIT:  Discharge Medication List as of 10/19/2020  4:27 AM    START taking these medications   Details  HYDROcodone-acetaminophen (NORCO/VICODIN) 5-325 MG tablet Take 1 tablet by mouth every 6 (six) hours as needed for severe pain., Starting Wed  10/19/2020, Normal    ibuprofen (ADVIL) 800 MG tablet Take 1 tablet (800 mg total) by mouth every 8 (eight) hours as needed for moderate pain., Starting Wed 10/19/2020, Normal    ondansetron (ZOFRAN) 4 MG tablet Take 1 tablet (4 mg total) by mouth every 8 (eight) hours as needed for nausea or vomiting., Starting Wed 10/19/2020, Normal    senna-docusate (SENOKOT-S) 8.6-50 MG tablet Take 1 tablet by mouth at bedtime as needed for mild constipation or moderate constipation., Starting Wed 10/19/2020, Normal    tamsulosin (FLOMAX) 0.4 MG CAPS capsule Take 1 capsule (0.4 mg total) by mouth daily for 14 days., Starting Wed 10/19/2020, Until Wed 11/02/2020, Normal        Note:  This document was prepared using Dragon voice recognition software and may include unintentional dictation errors.  Alona Bene, MD, [facility]    Rhyland Hinderliter, Arlyss Repress, MD 10/20/20 [phone]

## 2020-10-19 NOTE — ED Triage Notes (Signed)
Patient here from home reporting left sided flank pain. Followed by urology. Reports that he was scheduled for lithotripsy but did not have it because "I was going to be out of town". Hx of stones.

## 2020-10-20 LAB — URINE CULTURE: Culture: NO GROWTH

## 2021-03-27 ENCOUNTER — Encounter: Payer: Self-pay | Admitting: Podiatry

## 2021-03-27 ENCOUNTER — Other Ambulatory Visit: Payer: Self-pay

## 2021-03-27 ENCOUNTER — Ambulatory Visit (INDEPENDENT_AMBULATORY_CARE_PROVIDER_SITE_OTHER): Payer: 59

## 2021-03-27 ENCOUNTER — Ambulatory Visit (INDEPENDENT_AMBULATORY_CARE_PROVIDER_SITE_OTHER): Payer: 59 | Admitting: Podiatry

## 2021-03-27 DIAGNOSIS — M79671 Pain in right foot: Secondary | ICD-10-CM | POA: Diagnosis not present

## 2021-03-27 DIAGNOSIS — M722 Plantar fascial fibromatosis: Secondary | ICD-10-CM

## 2021-03-27 DIAGNOSIS — M79672 Pain in left foot: Secondary | ICD-10-CM

## 2021-03-27 MED ORDER — MELOXICAM 15 MG PO TABS: 15.0000 mg | ORAL_TABLET | Freq: Every day | ORAL | 0 refills | Status: AC

## 2021-03-27 MED ORDER — TRIAMCINOLONE ACETONIDE 10 MG/ML IJ SUSP
10.0000 mg | Freq: Once | INTRAMUSCULAR | Status: AC
  Administered 2021-03-27: 10 mg

## 2021-03-27 NOTE — Patient Instructions (Signed)
If was nice to meet you today. If you have any questions or any further concerns, please feel fee to give me a call. You can call our office at [phone] or please feel fee to send me a message through MyChart.   ----  For instructions on how to put on your Plantar Fascial Brace, please visit [email or web address]  ---     Plantar Fasciitis (Heel Spur Syndrome) with Rehab The plantar fascia is a fibrous, ligament-like, soft-tissue structure that spans the bottom of the foot. Plantar fasciitis is a condition that causes pain in the foot due to inflammation of the tissue. SYMPTOMS  Pain and tenderness on the underneath side of the foot. Pain that worsens with standing or walking. CAUSES  Plantar fasciitis is caused by irritation and injury to the plantar fascia on the underneath side of the foot. Common mechanisms of injury include: Direct trauma to bottom of the foot. Damage to a small nerve that runs under the foot where the main fascia attaches to the heel bone. Stress placed on the plantar fascia due to bone spurs. RISK INCREASES WITH:  Activities that place stress on the plantar fascia (running, jumping, pivoting, or cutting). Poor strength and flexibility. Improperly fitted shoes. Tight calf muscles. Flat feet. Failure to warm-up properly before activity. Obesity. PREVENTION Warm up and stretch properly before activity. Allow for adequate recovery between workouts. Maintain physical fitness: Strength, flexibility, and endurance. Cardiovascular fitness. Maintain a health body weight. Avoid stress on the plantar fascia. Wear properly fitted shoes, including arch supports for individuals who have flat feet.  PROGNOSIS  If treated properly, then the symptoms of plantar fasciitis usually resolve without surgery. However, occasionally surgery is necessary.  RELATED COMPLICATIONS  Recurrent symptoms that may result in a chronic condition. Problems of the lower back  that are caused by compensating for the injury, such as limping. Pain or weakness of the foot during push-off following surgery. Chronic inflammation, scarring, and partial or complete fascia tear, occurring more often from repeated injections.  TREATMENT  Treatment initially involves the use of ice and medication to help reduce pain and inflammation. The use of strengthening and stretching exercises may help reduce pain with activity, especially stretches of the Achilles tendon. These exercises may be performed at home or with a therapist. Your caregiver may recommend that you use heel cups of arch supports to help reduce stress on the plantar fascia. Occasionally, corticosteroid injections are given to reduce inflammation. If symptoms persist for greater than 6 months despite non-surgical (conservative), then surgery may be recommended.   MEDICATION  If pain medication is necessary, then nonsteroidal anti-inflammatory medications, such as aspirin and ibuprofen, or other minor pain relievers, such as acetaminophen, are often recommended. Do not take pain medication within 7 days before surgery. Prescription pain relievers may be given if deemed necessary by your caregiver. Use only as directed and only as much as you need. Corticosteroid injections may be given by your caregiver. These injections should be reserved for the most serious cases, because they may only be given a certain number of times.  HEAT AND COLD Cold treatment (icing) relieves pain and reduces inflammation. Cold treatment should be applied for 10 to 15 minutes every 2 to 3 hours for inflammation and pain and immediately after any activity that aggravates your symptoms. Use ice packs or massage the area with a piece of ice (ice massage). Heat treatment may be used prior to performing the stretching and strengthening activities prescribed by your caregiver,   physical therapist, or athletic trainer. Use a heat pack or soak the injury  in warm water.  SEEK IMMEDIATE MEDICAL CARE IF: Treatment seems to offer no benefit, or the condition worsens. Any medications produce adverse side effects.  EXERCISES- RANGE OF MOTION (ROM) AND STRETCHING EXERCISES - Plantar Fasciitis (Heel Spur Syndrome) These exercises may help you when beginning to rehabilitate your injury. Your symptoms may resolve with or without further involvement from your physician, physical therapist or athletic trainer. While completing these exercises, remember:  Restoring tissue flexibility helps normal motion to return to the joints. This allows healthier, less painful movement and activity. An effective stretch should be held for at least 30 seconds. A stretch should never be painful. You should only feel a gentle lengthening or release in the stretched tissue.  RANGE OF MOTION - Toe Extension, Flexion Sit with your right / left leg crossed over your opposite knee. Grasp your toes and gently pull them back toward the top of your foot. You should feel a stretch on the bottom of your toes and/or foot. Hold this stretch for 10 seconds. Now, gently pull your toes toward the bottom of your foot. You should feel a stretch on the top of your toes and or foot. Hold this stretch for 10 seconds. Repeat  times. Complete this stretch 3 times per day.   RANGE OF MOTION - Ankle Dorsiflexion, Active Assisted Remove shoes and sit on a chair that is preferably not on a carpeted surface. Place right / left foot under knee. Extend your opposite leg for support. Keeping your heel down, slide your right / left foot back toward the chair until you feel a stretch at your ankle or calf. If you do not feel a stretch, slide your bottom forward to the edge of the chair, while still keeping your heel down. Hold this stretch for 10 seconds. Repeat 3 times. Complete this stretch 2 times per day.   STRETCH  Gastroc, Standing Place hands on wall. Extend right / left leg, keeping the  front knee somewhat bent. Slightly point your toes inward on your back foot. Keeping your right / left heel on the floor and your knee straight, shift your weight toward the wall, not allowing your back to arch. You should feel a gentle stretch in the right / left calf. Hold this position for 10 seconds. Repeat 3 times. Complete this stretch 2 times per day.  STRETCH  Soleus, Standing Place hands on wall. Extend right / left leg, keeping the other knee somewhat bent. Slightly point your toes inward on your back foot. Keep your right / left heel on the floor, bend your back knee, and slightly shift your weight over the back leg so that you feel a gentle stretch deep in your back calf. Hold this position for 10 seconds. Repeat 3 times. Complete this stretch 2 times per day.  STRETCH  Gastrocsoleus, Standing  Note: This exercise can place a lot of stress on your foot and ankle. Please complete this exercise only if specifically instructed by your caregiver.  Place the ball of your right / left foot on a step, keeping your other foot firmly on the same step. Hold on to the wall or a rail for balance. Slowly lift your other foot, allowing your body weight to press your heel down over the edge of the step. You should feel a stretch in your right / left calf. Hold this position for 10 seconds. Repeat this exercise with a   slight bend in your right / left knee. Repeat 3 times. Complete this stretch 2 times per day.   STRENGTHENING EXERCISES - Plantar Fasciitis (Heel Spur Syndrome)  These exercises may help you when beginning to rehabilitate your injury. They may resolve your symptoms with or without further involvement from your physician, physical therapist or athletic trainer. While completing these exercises, remember:  Muscles can gain both the endurance and the strength needed for everyday activities through controlled exercises. Complete these exercises as instructed by your physician,  physical therapist or athletic trainer. Progress the resistance and repetitions only as guided.  STRENGTH - Towel Curls Sit in a chair positioned on a non-carpeted surface. Place your foot on a towel, keeping your heel on the floor. Pull the towel toward your heel by only curling your toes. Keep your heel on the floor. Repeat 3 times. Complete this exercise 2 times per day.  STRENGTH - Ankle Inversion Secure one end of a rubber exercise band/tubing to a fixed object (table, pole). Loop the other end around your foot just before your toes. Place your fists between your knees. This will focus your strengthening at your ankle. Slowly, pull your big toe up and in, making sure the band/tubing is positioned to resist the entire motion. Hold this position for 10 seconds. Have your muscles resist the band/tubing as it slowly pulls your foot back to the starting position. Repeat 3 times. Complete this exercises 2 times per day.  Document Released: 11/12/2005 Document Revised: 02/04/2012 Document Reviewed: 02/24/2009 [employer] Patient Information 2014 [employer].  

## 2021-03-28 NOTE — Progress Notes (Signed)
Subjective:   Patient ID: James Waters, male   DOB: 40 y.o.   MRN: [id number]   HPI 40 year old male presents the office today for concerns of left heel pain much worse than the right.  He states that October 2021 he was on a road trip to [place] without the Cardiz[place] and started having discomfort at the heel.  He states that he has been getting worse.  About 10 days ago he stepped on a softball day and he has sharp pain to his heel.  No injury.  No increased swelling.  No bruising.  Feels a sharp knife in the bottom of his heel.  Pain fluctuates.  No other concerns.   Review of Systems  All other systems reviewed and are negative.  Past Medical History:  Diagnosis Date  . ADHD    adderall  . Lumbar stenosis   . Nephrolithiasis     Past Surgical History:  Procedure Laterality Date  . NO PAST SURGERIES       Current Outpatient Medications:  .  meloxicam (MOBIC) 15 MG tablet, Take 1 tablet (15 mg total) by mouth daily., Disp: 30 tablet, Rfl: 0 .  ibuprofen (ADVIL) 800 MG tablet, Take 1 tablet (800 mg total) by mouth every 8 (eight) hours as needed for moderate pain., Disp: 21 tablet, Rfl: 0  Allergies  Allergen Reactions  . Oxycodone Hives  . Percocet [Oxycodone-Acetaminophen] Nausea And Vomiting        Objective:  Physical Exam  General: AAO x3, NAD  Dermatological: Skin is warm, dry and supple bilateral.  There are no open sores, no preulcerative lesions, no rash or signs of infection present.  Vascular: Dorsalis Pedis artery and Posterior Tibial artery pedal pulses are 2/4 bilateral with immedate capillary fill time. There is no pain with calf compression, swelling, warmth, erythema.   Neruologic: Grossly intact via light touch bilateral.  Negative Tinel sign.  Musculoskeletal: Tenderness to palpation along the plantar medial tubercle of the calcaneus at the insertion of plantar fascia on the left >> right foot. There is no pain along the course of the plantar  fascia within the arch of the foot. Plantar fascia appears to be intact. There is no pain with lateral compression of the calcaneus or pain with vibratory sensation. There is no pain along the course or insertion of the achilles tendon. No other areas of tenderness to bilateral lower extremities. Muscular strength 5/5 in all groups tested bilateral.  Gait: Unassisted, Nonantalgic.       Assessment:   Left heel pain, plantar fasciitis     Plan:  -Treatment options discussed including all alternatives, risks, and complications -Etiology of symptoms were discussed -X-rays obtained reviewed.  No evidence of acute fracture. -Steroid injection performed.  See procedure note below. -Plantar fascial brace dispensed -Discussed stretching, icing.  Discussed shoe modifications and orthotics.  Procedure: Injection Tendon/Ligament Discussed alternatives, risks, complications and verbal consent was obtained.  Location: LEFT plantar fascia at the glabrous junction; medial approach. Skin Prep: Alcohol Injectate: 0.5cc 0.5% marcaine plain, 0.5 cc 2% lidocaine plain and, 1 cc kenalog 10. Disposition: Patient tolerated procedure well. Injection site dressed with a band-aid.  Post-injection care was discussed and return precautions discussed.   Return in about 4 weeks (around 04/24/2021) for heel pain .  Vivi Barrack DPM

## 2021-04-27 ENCOUNTER — Ambulatory Visit: Payer: 59 | Admitting: Podiatry

## 2021-06-22 ENCOUNTER — Emergency Department (HOSPITAL_COMMUNITY): Payer: 59

## 2021-06-22 ENCOUNTER — Encounter (HOSPITAL_COMMUNITY): Payer: Self-pay

## 2021-06-22 ENCOUNTER — Other Ambulatory Visit: Payer: Self-pay

## 2021-06-22 ENCOUNTER — Emergency Department (HOSPITAL_COMMUNITY)
Admission: EM | Admit: 2021-06-22 | Discharge: 2021-06-22 | Disposition: A | Discharge status: Home / Self Care | Payer: 59 | Attending: Emergency Medicine | Admitting: Emergency Medicine

## 2021-06-22 DIAGNOSIS — R1032 Left lower quadrant pain: Secondary | ICD-10-CM | POA: Diagnosis present

## 2021-06-22 DIAGNOSIS — N201 Calculus of ureter: Secondary | ICD-10-CM | POA: Diagnosis not present

## 2021-06-22 DIAGNOSIS — R197 Diarrhea, unspecified: Secondary | ICD-10-CM | POA: Insufficient documentation

## 2021-06-22 LAB — COMPREHENSIVE METABOLIC PANEL
ALT: 40 U/L (ref 0–44)
AST: 22 U/L (ref 15–41)
Albumin: 4.8 g/dL (ref 3.5–5.0)
Alkaline Phosphatase: 72 U/L (ref 38–126)
Anion gap: 10 (ref 5–15)
BUN: 15 mg/dL (ref 6–20)
CO2: 23 mmol/L (ref 22–32)
Calcium: 9 mg/dL (ref 8.9–10.3)
Chloride: 106 mmol/L (ref 98–111)
Creatinine, Ser: 0.91 mg/dL (ref 0.61–1.24)
GFR, Estimated: >60 mL/min (ref >60)
Glucose, Bld: 98 mg/dL (ref 70–99)
Potassium: 4 mmol/L (ref 3.5–5.1)
Sodium: 139 mmol/L (ref 135–145)
Total Bilirubin: 0.7 mg/dL (ref 0.3–1.2)
Total Protein: 7.3 g/dL (ref 6.5–8.1)

## 2021-06-22 LAB — CBC
HCT: 49.7 % (ref 39.0–52.0)
Hemoglobin: 16.2 g/dL (ref 13.0–17.0)
MCH: 28 pg (ref 26.0–34.0)
MCHC: 32.6 g/dL (ref 30.0–36.0)
MCV: 86 fL (ref 80.0–100.0)
Platelets: 213 10*3/uL (ref 150–400)
RBC: 5.78 MIL/uL (ref 4.22–5.81)
RDW: 12.4 % (ref 11.5–15.5)
WBC: 8.2 10*3/uL (ref 4.0–10.5)
nRBC: 0 % (ref 0.0–0.2)

## 2021-06-22 LAB — URINALYSIS, ROUTINE W REFLEX MICROSCOPIC
Bilirubin Urine: NEGATIVE
Glucose, UA: NEGATIVE mg/dL
Ketones, ur: 5 mg/dL — AB
Leukocytes,Ua: NEGATIVE
Nitrite: NEGATIVE
Protein, ur: NEGATIVE mg/dL
Specific Gravity, Urine: 1.004 — ABNORMAL LOW (ref 1.005–1.030)
pH: 5 (ref 5.0–8.0)

## 2021-06-22 LAB — LIPASE, BLOOD: Lipase: 31 U/L (ref 11–51)

## 2021-06-22 MED ORDER — HYDROCODONE-ACETAMINOPHEN 5-325 MG PO TABS
1.0000 | ORAL_TABLET | Freq: Four times a day (QID) | ORAL | 0 refills | Status: DC | PRN
Start: 2021-06-22 — End: 2021-07-27

## 2021-06-22 MED ORDER — ONDANSETRON HCL 4 MG PO TABS: 4.0000 mg | ORAL_TABLET | Freq: Three times a day (TID) | ORAL | 0 refills | Status: DC | PRN

## 2021-06-22 MED ORDER — KETOROLAC TROMETHAMINE 30 MG/ML IJ SOLN
60.0000 mg | Freq: Once | INTRAMUSCULAR | Status: AC
  Administered 2021-06-22: 60 mg via INTRAMUSCULAR
  Filled 2021-06-22: qty 2

## 2021-06-22 NOTE — ED Notes (Signed)
Pt in CT.

## 2021-06-22 NOTE — Discharge Instructions (Addendum)
You were seen in the emergency department for left lower quadrant abdominal pain nausea vomiting.  You had labs urinalysis and a CAT scan that showed a 7 mm distal stone on the left.  This is likely the source of your symptoms.  We are prescribing some pain medicine and nausea medication.  Please continue your Flomax.  Contact urology for close follow-up.  Return to the emergency department if uncontrolled pain or any fevers.

## 2021-06-22 NOTE — ED Provider Notes (Signed)
Emergency Medicine Provider Triage Evaluation Note  James Waters , a 40 y.o. male  was evaluated in triage.  Pt complains of diarrhea, nausea, vomiting since this morning. Stabbing pain on LLQ radiates to scrotum. No isolated scrotal pain or swelling. Normal urination, no blood.No bloody diarrhea. No fevers   Review of Systems  Positive: N,v,d, LLQ Negative:   Physical Exam  BP (!) 168/106 (BP Location: Right Arm)   Pulse 76   Temp 98.1 F (36.7 C) (Oral)   Resp 16   Ht 5\' 9"  (1.753 m)   Wt 90.7 kg   SpO2 99%   BMI 29.53 kg/m  Gen:   Awake, no distress   Resp:  Normal effort  MSK:   Moves extremities without difficulty  Other:  LLQ and LUQ pain, no rebound   Medical Decision Making  Medically screening exam initiated at 12:26 PM.  Appropriate orders placed.  [name] was informed that the remainder of the evaluation will be completed by another provider, this initial triage assessment does not replace that evaluation, and the importance of remaining in the ED until their evaluation is complete.  Diverticulitis vs kidney stone vs gastroenteritis, will get CT abdomen pelvis without to evaluate for both   Sharlot Gowda, PA-C 06/22/21 1229    06/24/21, MD 06/24/21 [phone]

## 2021-06-22 NOTE — ED Triage Notes (Signed)
Patient had a sudden onset of LLQ pain, N/v/D at 0800 today. Patient reports a history of kidney stones. Patient denies any issues with urinating.

## 2021-06-22 NOTE — ED Provider Notes (Signed)
[facility]-EMERGENCY DEPT Provider Note   CSN: [id number] Arrival date & time: 06/22/21  1147     History Chief Complaint  Patient presents with   Abdominal Pain   Emesis   Diarrhea    James Waters is a 40 y.o. male.  He is here for evaluation of acute onset of stabbing left lower quadrant abdominal pain associated with nausea and 5 episodes of vomiting.  No urinary symptoms.  No pain in the back.  No fevers or chills.  Some pressure into his testicles but no real testicular pain.  He says it feels similar to kidney stone that he said in the past.  No trauma.  The history is provided by the patient.  Abdominal Pain Pain location:  LLQ Pain quality: stabbing   Pain radiates to:  Scrotum Pain severity:  Severe Onset quality:  Sudden Duration:  3 hours Timing:  Constant Progression:  Improving Chronicity:  Recurrent Context: not trauma   Relieved by:  None tried Worsened by:  Nothing Ineffective treatments:  None tried Associated symptoms: diarrhea, nausea and vomiting   Associated symptoms: no chest pain, no constipation, no cough, no dysuria, no fever, no hematuria, no shortness of breath and no sore throat   Emesis Associated symptoms: abdominal pain and diarrhea   Associated symptoms: no cough, no fever, no headaches and no sore throat   Diarrhea Associated symptoms: abdominal pain and vomiting   Associated symptoms: no fever and no headaches       Past Medical History:  Diagnosis Date   ADHD    adderall   Lumbar stenosis    Nephrolithiasis     There are no problems to display for this patient.   Past Surgical History:  Procedure Laterality Date   NO PAST SURGERIES         Family History  Problem Relation Age of Onset   Breast cancer Mother    Thyroid cancer Mother    Asthma Sister    COPD Sister    Kidney Stones Sister     Social History   Tobacco Use   Smoking status: Never   Smokeless tobacco: Never  Vaping  Use   Vaping Use: Never used  Substance Use Topics   Alcohol use: No   Drug use: No    Home Medications Prior to Admission medications   Medication Sig Start Date End Date Taking? Authorizing Provider  ibuprofen (ADVIL) 800 MG tablet Take 1 tablet (800 mg total) by mouth every 8 (eight) hours as needed for moderate pain. 10/19/20   Long, Arlyss Repress, MD  meloxicam (MOBIC) 15 MG tablet Take 1 tablet (15 mg total) by mouth daily. 03/27/21 03/27/22  Vivi Barrack, DPM    Allergies    Oxycodone and Percocet [oxycodone-acetaminophen]  Review of Systems   Review of Systems  Constitutional:  Negative for fever.  HENT:  Negative for sore throat.   Eyes:  Negative for visual disturbance.  Respiratory:  Negative for cough and shortness of breath.   Cardiovascular:  Negative for chest pain.  Gastrointestinal:  Positive for abdominal pain, diarrhea, nausea and vomiting. Negative for constipation.  Genitourinary:  Negative for dysuria and hematuria.  Musculoskeletal:  Negative for back pain.  Skin:  Negative for rash.  Neurological:  Negative for headaches.   Physical Exam Updated Vital Signs BP (!) 168/106 (BP Location: Right Arm)   Pulse 76   Temp 98.1 F (36.7 C) (Oral)   Resp 16  Ht 5\' 9"  (1.753 m)   Wt 90.7 kg   SpO2 99%   BMI 29.53 kg/m   Physical Exam Vitals and nursing note reviewed.  Constitutional:      Appearance: Normal appearance. He is well-developed.  HENT:     Head: Normocephalic and atraumatic.  Eyes:     Conjunctiva/sclera: Conjunctivae normal.  Cardiovascular:     Rate and Rhythm: Normal rate and regular rhythm.     Heart sounds: No murmur heard. Pulmonary:     Effort: Pulmonary effort is normal. No respiratory distress.     Breath sounds: Normal breath sounds.  Abdominal:     General: Abdomen is flat.     Palpations: Abdomen is soft.     Tenderness: There is no abdominal tenderness. There is no guarding or rebound.  Musculoskeletal:        General:  No deformity or signs of injury. Normal range of motion.     Cervical back: Neck supple.  Skin:    General: Skin is warm and dry.     Capillary Refill: Capillary refill takes less than 2 seconds.  Neurological:     General: No focal deficit present.     Mental Status: He is alert.    ED Results / Procedures / Treatments   Labs (all labs ordered are listed, but only abnormal results are displayed) Labs Reviewed  URINALYSIS, ROUTINE W REFLEX MICROSCOPIC - Abnormal; Notable for the following components:      Result Value   Color, Urine STRAW (*)    Specific Gravity, Urine 1.004 (*)    Hgb urine dipstick MODERATE (*)    Ketones, ur 5 (*)    Bacteria, UA RARE (*)    All other components within normal limits  LIPASE, BLOOD  COMPREHENSIVE METABOLIC PANEL  CBC    EKG None  Radiology CT Abdomen Pelvis Wo Contrast  Result Date: 06/22/2021 CLINICAL DATA:  40 year old male with flank pain. EXAM: CT ABDOMEN AND PELVIS WITHOUT CONTRAST TECHNIQUE: Multidetector CT imaging of the abdomen and pelvis was performed following the standard protocol without IV contrast. CONTRAST:  None COMPARISON:  CT and pelvis, 10/19/2020. FINDINGS: Lower chest: Lung bases are clear Hepatobiliary: No focal liver abnormality is seen. No gallstones, gallbladder wall thickening, or biliary dilatation. Pancreas: Unremarkable. No pancreatic ductal dilatation or surrounding inflammatory changes. Spleen: Normal in size without focal abnormality. Adrenals/Urinary Tract: Adrenal glands are unremarkable. The right kidney is normal, without renal calculi, focal lesion, or hydronephrosis. Bladder is unremarkable. Trace left renal stranding. Punctate radiodense renal calculus within the left inferior renal pole collecting system. Mild left hydronephrosis. Trace left ureteral stranding. Partially-obstructing, 7 mm calculus at the left distal ureter - noting interval migration from the proximal ureter on comparison imaging  Stomach/Bowel: Stomach is within normal limits. Appendix appears normal. No evidence of bowel wall thickening, distention, or inflammatory changes. Vascular/Lymphatic: No significant vascular findings are present. No enlarged abdominal or pelvic lymph nodes. Reproductive: Vasectomies. Prostatic calcifications, greater than expected in a patient of this age. Other: Small fat-containing inguinal hernias. No abdominopelvic ascites. Musculoskeletal: No acute or significant osseous findings. IMPRESSION: 1. Partially-obstructing, 7 mm calculus at the left distal ureter with associated mild left hydronephrosis. 2. Punctate nonobstructing stone within the left inferior renal pole, unchanged. Electronically Signed   By: 10/21/2020 MD   On: 06/22/2021 14:23    Procedures Procedures   Medications Ordered in ED Medications  ketorolac (TORADOL) 30 MG/ML injection 60 mg (60 mg Intramuscular Given 06/22/21  1447)    ED Course  I have reviewed the triage vital signs and the nursing notes.  Pertinent labs & imaging results that were available during my care of the patient were reviewed by me and considered in my medical decision making (see chart for details).  Clinical Course as of 06/22/21 1745  Thu Jun 22, 2021  1437 CT showing 7 mm distal left ureteral stone with mild hydro-.  Reviewed this with patient.  He is comfortable going home on some pain medicine and he follows with Dr. Liliane Shi in [place].  He has some Flomax.  We will give him an IM dose of some Toradol before he leaves as he looks fairly comfortable. [MB]    Clinical Course User Index [MB] Terrilee Files, MD   MDM Rules/Calculators/A&P                          This patient complains of acute stabbing left lower quadrant abdominal pain with nausea and vomiting; this involves an extensive number of treatment Options and is a complaint that carries with it a high risk of complications and Morbidity. The differential includes incarcerated  hernia, obstruction, diverticulitis, perforation, renal colic  I ordered, reviewed and interpreted labs, which included CBC with normal white count normal hemoglobin, chemistries and LFTs normal, urinalysis without signs of infection I ordered medication IM Toradol I ordered imaging studies which included CT renal and I independently    visualized and interpreted imaging which showed 7 mm distal stone on left with some mild hydronephrosis  Previous records obtained and reviewed in epic, patient has been seen before for renal colic  After the interventions stated above, I reevaluated the patient and found patient feels his pain is controlled and is comfortable plan for outpatient discharge pain control and outpatient follow-up with urology.  Return instructions discussed   Final Clinical Impression(s) / ED Diagnoses Final diagnoses:  Ureterolithiasis    Rx / DC Orders ED Discharge Orders          Ordered    HYDROcodone-acetaminophen (NORCO/VICODIN) 5-325 MG tablet  Every 6 hours PRN        06/22/21 1439    ondansetron (ZOFRAN) 4 MG tablet  Every 8 hours PRN        06/22/21 1439             Terrilee Files, MD 06/22/21 1747

## 2021-07-27 ENCOUNTER — Other Ambulatory Visit: Payer: Self-pay

## 2021-07-27 ENCOUNTER — Emergency Department (HOSPITAL_COMMUNITY)
Admission: EM | Admit: 2021-07-27 | Discharge: 2021-07-27 | Disposition: A | Discharge status: Home / Self Care | Payer: 59 | Attending: Emergency Medicine | Admitting: Emergency Medicine

## 2021-07-27 ENCOUNTER — Emergency Department (HOSPITAL_COMMUNITY): Payer: 59

## 2021-07-27 ENCOUNTER — Encounter (HOSPITAL_COMMUNITY): Payer: Self-pay | Admitting: Emergency Medicine

## 2021-07-27 DIAGNOSIS — N200 Calculus of kidney: Secondary | ICD-10-CM | POA: Diagnosis not present

## 2021-07-27 DIAGNOSIS — Z20822 Contact with and (suspected) exposure to covid-19: Secondary | ICD-10-CM | POA: Insufficient documentation

## 2021-07-27 DIAGNOSIS — R109 Unspecified abdominal pain: Secondary | ICD-10-CM | POA: Diagnosis present

## 2021-07-27 LAB — CBC WITH DIFFERENTIAL/PLATELET
Abs Immature Granulocytes: 0.02 10*3/uL (ref 0.00–0.07)
Basophils Absolute: 0 10*3/uL (ref 0.0–0.1)
Basophils Relative: 1 %
Eosinophils Absolute: 0.1 10*3/uL (ref 0.0–0.5)
Eosinophils Relative: 2 %
HCT: 46.7 % (ref 39.0–52.0)
Hemoglobin: 15.2 g/dL (ref 13.0–17.0)
Immature Granulocytes: 0 %
Lymphocytes Relative: 29 %
Lymphs Abs: 2 10*3/uL (ref 0.7–4.0)
MCH: 28.1 pg (ref 26.0–34.0)
MCHC: 32.5 g/dL (ref 30.0–36.0)
MCV: 86.5 fL (ref 80.0–100.0)
Monocytes Absolute: 0.5 10*3/uL (ref 0.1–1.0)
Monocytes Relative: 7 %
Neutro Abs: 4.2 10*3/uL (ref 1.7–7.7)
Neutrophils Relative %: 61 %
Platelets: 229 10*3/uL (ref 150–400)
RBC: 5.4 MIL/uL (ref 4.22–5.81)
RDW: 12.7 % (ref 11.5–15.5)
WBC: 6.9 10*3/uL (ref 4.0–10.5)
nRBC: 0 % (ref 0.0–0.2)

## 2021-07-27 LAB — RESP PANEL BY RT-PCR (FLU A&B, COVID) ARPGX2
Influenza A by PCR: NEGATIVE
Influenza B by PCR: NEGATIVE
SARS Coronavirus 2 by RT PCR: NEGATIVE

## 2021-07-27 LAB — COMPREHENSIVE METABOLIC PANEL
ALT: 36 U/L (ref 0–44)
AST: 23 U/L (ref 15–41)
Albumin: 4.2 g/dL (ref 3.5–5.0)
Alkaline Phosphatase: 70 U/L (ref 38–126)
Anion gap: 5 (ref 5–15)
BUN: 13 mg/dL (ref 6–20)
CO2: 26 mmol/L (ref 22–32)
Calcium: 8.7 mg/dL — ABNORMAL LOW (ref 8.9–10.3)
Chloride: 107 mmol/L (ref 98–111)
Creatinine, Ser: 0.94 mg/dL (ref 0.61–1.24)
GFR, Estimated: >60 mL/min (ref >60)
Glucose, Bld: 100 mg/dL — ABNORMAL HIGH (ref 70–99)
Potassium: 3.6 mmol/L (ref 3.5–5.1)
Sodium: 138 mmol/L (ref 135–145)
Total Bilirubin: 0.8 mg/dL (ref 0.3–1.2)
Total Protein: 7 g/dL (ref 6.5–8.1)

## 2021-07-27 LAB — URINALYSIS, ROUTINE W REFLEX MICROSCOPIC
Bacteria, UA: NONE SEEN
Bilirubin Urine: NEGATIVE
Glucose, UA: NEGATIVE mg/dL
Ketones, ur: 40 mg/dL — AB
Leukocytes,Ua: NEGATIVE
Nitrite: NEGATIVE
Protein, ur: NEGATIVE mg/dL
Specific Gravity, Urine: 1.025 (ref 1.005–1.030)
pH: 6 (ref 5.0–8.0)

## 2021-07-27 LAB — LIPASE, BLOOD: Lipase: 29 U/L (ref 11–51)

## 2021-07-27 MED ORDER — ONDANSETRON HCL 4 MG/2ML IJ SOLN
4.0000 mg | Freq: Once | INTRAMUSCULAR | Status: AC
  Administered 2021-07-27: 4 mg via INTRAVENOUS
  Filled 2021-07-27: qty 2

## 2021-07-27 MED ORDER — FENTANYL CITRATE PF 50 MCG/ML IJ SOSY
50.0000 ug | PREFILLED_SYRINGE | Freq: Once | INTRAMUSCULAR | Status: AC
  Administered 2021-07-27: 50 ug via INTRAVENOUS
  Filled 2021-07-27: qty 1

## 2021-07-27 MED ORDER — SODIUM CHLORIDE 0.9 % IV BOLUS
500.0000 mL | Freq: Once | INTRAVENOUS | Status: AC
  Administered 2021-07-27: 500 mL via INTRAVENOUS

## 2021-07-27 MED ORDER — MORPHINE SULFATE (PF) 4 MG/ML IV SOLN
4.0000 mg | Freq: Once | INTRAVENOUS | Status: DC
  Filled 2021-07-27: qty 1

## 2021-07-27 MED ORDER — KETOROLAC TROMETHAMINE 15 MG/ML IJ SOLN
15.0000 mg | Freq: Once | INTRAMUSCULAR | Status: AC
  Administered 2021-07-27: 15 mg via INTRAVENOUS
  Filled 2021-07-27: qty 1

## 2021-07-27 MED ORDER — ONDANSETRON HCL 4 MG PO TABS: 4.0000 mg | ORAL_TABLET | Freq: Three times a day (TID) | ORAL | 0 refills | Status: AC | PRN

## 2021-07-27 MED ORDER — HYDROCODONE-ACETAMINOPHEN 5-325 MG PO TABS: 1.0000 | ORAL_TABLET | Freq: Four times a day (QID) | ORAL | 0 refills | Status: DC | PRN

## 2021-07-27 MED ORDER — HYDROCODONE-ACETAMINOPHEN 5-325 MG PO TABS
1.0000 | ORAL_TABLET | Freq: Once | ORAL | Status: AC
Start: 2021-07-27 — End: 2021-07-27
  Administered 2021-07-27: 1 via ORAL
  Filled 2021-07-27 (×2): qty 1

## 2021-07-27 MED ORDER — SODIUM CHLORIDE 0.9 % IV BOLUS
1000.0000 mL | Freq: Once | INTRAVENOUS | Status: AC
  Administered 2021-07-27: 1000 mL via INTRAVENOUS

## 2021-07-27 NOTE — ED Notes (Signed)
Patient is gone for testing. Unable to update vitals. 

## 2021-07-27 NOTE — ED Notes (Signed)
D/c paerwork reviewed with pt, including prescriptions.  Pt verbalized understanding, ambulatory to ED exit to meet ride.

## 2021-07-27 NOTE — Discharge Instructions (Addendum)
No evidence of urine infection today.  Please return if you develop any fever or worsening pain, nausea, vomiting.  Follow-up with urology.

## 2021-07-27 NOTE — ED Triage Notes (Signed)
PT c/o L flank pain since 0100. Hx kidney stone in July. States pain mimics previous stone. Last urine output 0100 despite drinking 60oz of water since that time. Endorses N/V.

## 2021-07-27 NOTE — ED Provider Notes (Signed)
[facility]-EMERGENCY DEPT Provider Note   CSN: [id number] Arrival date & time: 07/27/21  0850     History Chief Complaint  Patient presents with   Flank Pain    James Waters is a 40 y.o. male.  Left flank pain since last night.  History of kidney stones.  Denies any hematuria.  The history is provided by the patient.  Flank Pain This is a new problem. The current episode started 6 to 12 hours ago. The problem occurs constantly. The problem has not changed since onset.Pertinent negatives include no chest pain, no abdominal pain, no headaches and no shortness of breath. Nothing aggravates the symptoms. Nothing relieves the symptoms. He has tried nothing for the symptoms. The treatment provided no relief.      Past Medical History:  Diagnosis Date   ADHD    adderall   Lumbar stenosis    Nephrolithiasis     There are no problems to display for this patient.   Past Surgical History:  Procedure Laterality Date   NO PAST SURGERIES         Family History  Problem Relation Age of Onset   Breast cancer Mother    Thyroid cancer Mother    Asthma Sister    COPD Sister    Kidney Stones Sister     Social History   Tobacco Use   Smoking status: Never   Smokeless tobacco: Never  Vaping Use   Vaping Use: Never used  Substance Use Topics   Alcohol use: No   Drug use: No    Home Medications Prior to Admission medications   Medication Sig Start Date End Date Taking? Authorizing Provider  HYDROcodone-acetaminophen (NORCO/VICODIN) 5-325 MG tablet Take 1 tablet by mouth every 6 (six) hours as needed for severe pain. 07/27/21   Unique Sillas, DO  meloxicam (MOBIC) 15 MG tablet Take 1 tablet (15 mg total) by mouth daily. Patient not taking: Reported on 07/27/2021 03/27/21 03/27/22  Vivi Barrack, DPM  ondansetron (ZOFRAN) 4 MG tablet Take 1 tablet (4 mg total) by mouth every 8 (eight) hours as needed for nausea or vomiting. 07/27/21   Virgina Norfolk,  DO    Allergies    Oxycodone and Percocet [oxycodone-acetaminophen]  Review of Systems   Review of Systems  Constitutional:  Negative for chills and fever.  HENT:  Negative for ear pain and sore throat.   Eyes:  Negative for pain and visual disturbance.  Respiratory:  Negative for cough and shortness of breath.   Cardiovascular:  Negative for chest pain and palpitations.  Gastrointestinal:  Negative for abdominal pain and vomiting.  Genitourinary:  Positive for flank pain. Negative for decreased urine volume, difficulty urinating, dysuria, hematuria and testicular pain.  Musculoskeletal:  Negative for arthralgias and back pain.  Skin:  Negative for color change and rash.  Neurological:  Negative for seizures, syncope and headaches.  All other systems reviewed and are negative.  Physical Exam Updated Vital Signs BP 126/83 (BP Location: Left Arm)   Pulse 92   Temp 98.7 F (37.1 C) (Oral)   Resp 18   SpO2 97%   Physical Exam Vitals and nursing note reviewed.  Constitutional:      General: He is in acute distress.     Appearance: He is well-developed. He is not ill-appearing.  HENT:     Head: Normocephalic and atraumatic.  Eyes:     Extraocular Movements: Extraocular movements intact.     Conjunctiva/sclera: Conjunctivae normal.  Pupils: Pupils are equal, round, and reactive to light.  Cardiovascular:     Rate and Rhythm: Normal rate and regular rhythm.     Pulses: Normal pulses.     Heart sounds: Normal heart sounds. No murmur heard. Pulmonary:     Effort: Pulmonary effort is normal. No respiratory distress.     Breath sounds: Normal breath sounds.  Abdominal:     Palpations: Abdomen is soft.     Tenderness: There is no abdominal tenderness. There is left CVA tenderness.  Musculoskeletal:        General: Normal range of motion.     Cervical back: Neck supple.  Skin:    General: Skin is warm and dry.     Capillary Refill: Capillary refill takes less than 2  seconds.  Neurological:     Mental Status: He is alert.  Psychiatric:        Mood and Affect: Mood normal.    ED Results / Procedures / Treatments   Labs (all labs ordered are listed, but only abnormal results are displayed) Labs Reviewed  COMPREHENSIVE METABOLIC PANEL - Abnormal; Notable for the following components:      Result Value   Glucose, Bld 100 (*)    Calcium 8.7 (*)    All other components within normal limits  URINALYSIS, ROUTINE W REFLEX MICROSCOPIC - Abnormal; Notable for the following components:   Color, Urine YELLOW (*)    APPearance CLEAR (*)    Hgb urine dipstick LARGE (*)    Ketones, ur 40 (*)    All other components within normal limits  RESP PANEL BY RT-PCR (FLU A&B, COVID) ARPGX2  CBC WITH DIFFERENTIAL/PLATELET  LIPASE, BLOOD    EKG None  Radiology CT Renal Stone Study  Result Date: 07/27/2021 CLINICAL DATA:  Left flank pain EXAM: CT ABDOMEN AND PELVIS WITHOUT CONTRAST TECHNIQUE: Multidetector CT imaging of the abdomen and pelvis was performed following the standard protocol without IV contrast. COMPARISON:  CT abdomen/pelvis 06/22/2021 FINDINGS: Lower chest: Linear opacities in the left base and lingula likely reflects scar. The lung bases are otherwise clear. The imaged heart is unremarkable. Hepatobiliary: The liver and gallbladder are unremarkable. Pancreas: Unremarkable. Spleen: Unremarkable Adrenals/Urinary Tract: The adrenals are unremarkable. There is a 7 mm stone in the distal left ureter with mild upstream hydroureteronephrosis, unchanged since 06/22/2021. There is mild periureteral and perinephric stranding on the left, also similar to the prior study. A 3 mm nonobstructing left lower pole renal stone is also unchanged. There are no stones in the right kidney or along the course of the right ureter. No focal lesions are seen in either kidney. The bladder is unremarkable. Stomach/Bowel: The stomach is unremarkable. There is no evidence of bowel  obstruction. The appendix is unremarkable. Vascular/Lymphatic: The abdominal aorta is nonaneurysmal. There is no abdominal or pelvic lymphadenopathy. Reproductive: The prostate and seminal vesicles are stable, with unchanged prostatic calcifications. Other: A fat containing left inguinal hernia is unchanged. There is no ascites or free air. Musculoskeletal: There is no acute osseous abnormality or aggressive osseous lesion. IMPRESSION: Since 06/22/2021: 1. Unchanged partially obstructing 7 mm stone in the distal left ureter with mild upstream hydroureteronephrosis and periureteral/perinephric stranding. 2. Unchanged 3 mm nonobstructing left lower pole renal calculus. Electronically Signed   By: Lesia Hausen M.D.   On: 07/27/2021 10:35    Procedures Procedures   Medications Ordered in ED Medications  sodium chloride 0.9 % bolus 1,000 mL (1,000 mLs Intravenous New Bag/Given 07/27/21 0912)  ondansetron [facility]) injection 4 mg (4 mg Intravenous Given 07/27/21 0913)  fentaNYL (SUBLIMAZE) injection 50 mcg (50 mcg Intravenous Given 07/27/21 1013)  sodium chloride 0.9 % bolus 500 mL (500 mLs Intravenous New Bag/Given 07/27/21 1112)  ketorolac (TORADOL) 15 MG/ML injection 15 mg (15 mg Intravenous Given 07/27/21 1113)  fentaNYL (SUBLIMAZE) injection 50 mcg (50 mcg Intravenous Given 07/27/21 1113)  HYDROcodone-acetaminophen (NORCO/VICODIN) 5-325 MG per tablet 1 tablet (1 tablet Oral Given 07/27/21 1332)    ED Course  I have reviewed the triage vital signs and the nursing notes.  Pertinent labs & imaging results that were available during my care of the patient were reviewed by me and considered in my medical decision making (see chart for details).    MDM Rules/Calculators/A&P                           James Waters is a 40 year old male with history of kidney stones who presents to the ED with left flank pain.  Normal vitals.  No fever.  Pain in the left flank.  Seems to be consistent with prior kidney  stones.  Could be diverticulitis or UTI or pyelonephritis.  We will get basic labs including urinalysis and CT scan.  No testicular pain.  2:07 PM no significant white count, no significant kidney injury.  CT scan shows unchanged partially obstructed left-sided kidney stone, 7 mm.  Still having discomfort we will give another round of pain medications with Toradol and fentanyl.  Talked with Dr. Cardell Peach with urology given his ongoing pain and unchanged kidney stone that has been there for about a month.  If patient not feeling improved after round of pain medication will take to the OR for stent.  Will reevaluate patient.  Still awaiting urinalysis.  2:07 PM  Urinalysis negative for infection.  Pain has improved.  We will follow-up with urology outpatient.  Understands return precautions.  Pain medicine and nausea medicine sent to pharmacy.  This chart was dictated using voice recognition software.  Despite best efforts to proofread,  errors can occur which can change the documentation meaning.    Final Clinical Impression(s) / ED Diagnoses Final diagnoses:  Kidney stone    Rx / DC Orders ED Discharge Orders          Ordered    HYDROcodone-acetaminophen (NORCO/VICODIN) 5-325 MG tablet  Every 6 hours PRN,   Status:  Discontinued        07/27/21 1401    ondansetron (ZOFRAN) 4 MG tablet  Every 8 hours PRN        07/27/21 1401    HYDROcodone-acetaminophen (NORCO/VICODIN) 5-325 MG tablet  Every 6 hours PRN        07/27/21 1402             Joy Reiger, DO 07/27/21 1407

## 2021-08-02 ENCOUNTER — Other Ambulatory Visit: Payer: Self-pay | Admitting: Urology

## 2021-08-02 DIAGNOSIS — N201 Calculus of ureter: Secondary | ICD-10-CM

## 2021-08-08 NOTE — Progress Notes (Signed)
Patient to arrive at 0800 on 08/10/21. History and medications reviewed. Pre-procedure instructions given. NPO after MN except for clear liquids until 0600. Driver secured.

## 2021-08-10 ENCOUNTER — Ambulatory Visit (HOSPITAL_COMMUNITY): Payer: 59

## 2021-08-10 ENCOUNTER — Encounter (HOSPITAL_BASED_OUTPATIENT_CLINIC_OR_DEPARTMENT_OTHER): Payer: Self-pay | Admitting: Urology

## 2021-08-10 ENCOUNTER — Other Ambulatory Visit: Payer: Self-pay

## 2021-08-10 ENCOUNTER — Encounter (HOSPITAL_BASED_OUTPATIENT_CLINIC_OR_DEPARTMENT_OTHER)
Admission: RE | Disposition: A | Discharge status: Home / Self Care | Payer: Self-pay | Source: Ambulatory Visit | Attending: Urology

## 2021-08-10 ENCOUNTER — Ambulatory Visit (HOSPITAL_BASED_OUTPATIENT_CLINIC_OR_DEPARTMENT_OTHER)
Admission: RE | Admit: 2021-08-10 | Discharge: 2021-08-10 | Disposition: A | Discharge status: Home / Self Care | Payer: 59 | Source: Ambulatory Visit | Attending: Urology | Admitting: Urology

## 2021-08-10 DIAGNOSIS — N201 Calculus of ureter: Secondary | ICD-10-CM | POA: Insufficient documentation

## 2021-08-10 HISTORY — PX: EXTRACORPOREAL SHOCK WAVE LITHOTRIPSY: SHX1557

## 2021-08-10 SURGERY — LITHOTRIPSY, ESWL
Anesthesia: LOCAL | Laterality: Left

## 2021-08-10 MED ORDER — DIPHENHYDRAMINE HCL 25 MG PO CAPS
25.0000 mg | ORAL_CAPSULE | ORAL | Status: AC
  Administered 2021-08-10: 25 mg via ORAL

## 2021-08-10 MED ORDER — DIPHENHYDRAMINE HCL 25 MG PO CAPS
ORAL_CAPSULE | ORAL | Status: AC
  Filled 2021-08-10: qty 1

## 2021-08-10 MED ORDER — DIAZEPAM 5 MG PO TABS
10.0000 mg | ORAL_TABLET | ORAL | Status: AC
  Administered 2021-08-10: 10 mg via ORAL

## 2021-08-10 MED ORDER — CIPROFLOXACIN HCL 500 MG PO TABS
500.0000 mg | ORAL_TABLET | ORAL | Status: AC
  Administered 2021-08-10: 500 mg via ORAL

## 2021-08-10 MED ORDER — DIAZEPAM 5 MG PO TABS
ORAL_TABLET | ORAL | Status: AC
  Filled 2021-08-10: qty 2

## 2021-08-10 MED ORDER — CIPROFLOXACIN HCL 500 MG PO TABS
ORAL_TABLET | ORAL | Status: AC
  Filled 2021-08-10: qty 1

## 2021-08-10 MED ORDER — HYDROCODONE-ACETAMINOPHEN 5-325 MG PO TABS: 1.0000 | ORAL_TABLET | Freq: Four times a day (QID) | ORAL | 0 refills | Status: DC | PRN

## 2021-08-10 MED ORDER — SODIUM CHLORIDE 0.9 % IV SOLN: INTRAVENOUS | Status: DC

## 2021-08-10 NOTE — H&P (Signed)
See scanned H&P

## 2021-08-10 NOTE — Op Note (Signed)
See [facility] OP note scanned into chart. Also because of the size, density, location and other factors that cannot be anticipated I feel this will likely be a staged procedure. This fact supersedes any indication in the scanned [facility] operative note to the contrary.  

## 2021-08-11 ENCOUNTER — Encounter (HOSPITAL_BASED_OUTPATIENT_CLINIC_OR_DEPARTMENT_OTHER): Payer: Self-pay | Admitting: Urology

## 2022-05-18 IMAGING — CT CT RENAL STONE PROTOCOL
2 of 4 series · 16 of 46 positions shown, 18 images · non-contrast
Comparison: CT abdomen/pelvis 06/22/2021

CLINICAL DATA: Left flank pain

EXAM:
CT ABDOMEN AND PELVIS WITHOUT CONTRAST
TECHNIQUE: Multidetector CT imaging of the abdomen and pelvis was performed
following the standard protocol without IV contrast.

[Series 2: axial st · axial · 0.98mm/px · z∈[+906,+1386]mm · 13 of 108 slices shown, 15 images]
[im 6/108  soft-tissue]
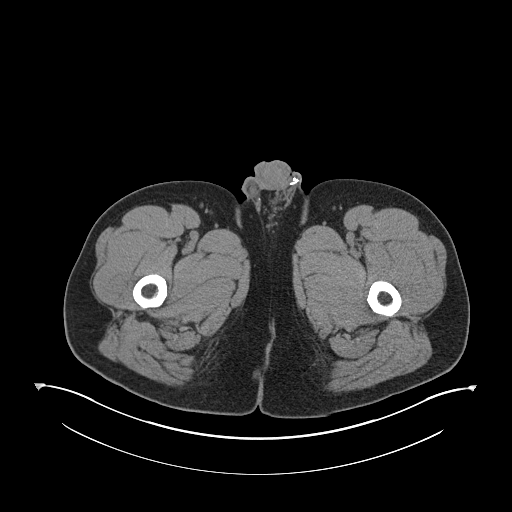
[im 6/108  bone]
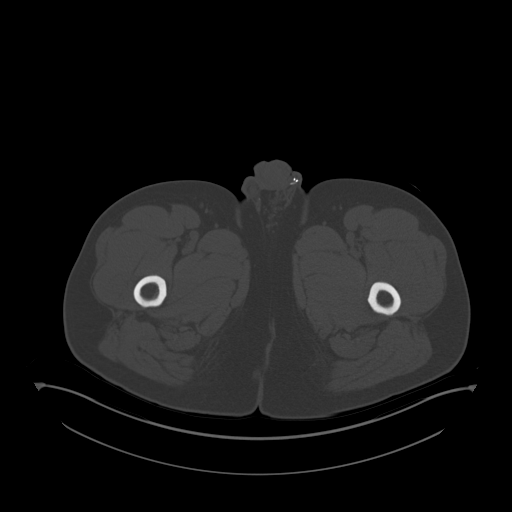
[im 12/108  soft-tissue]
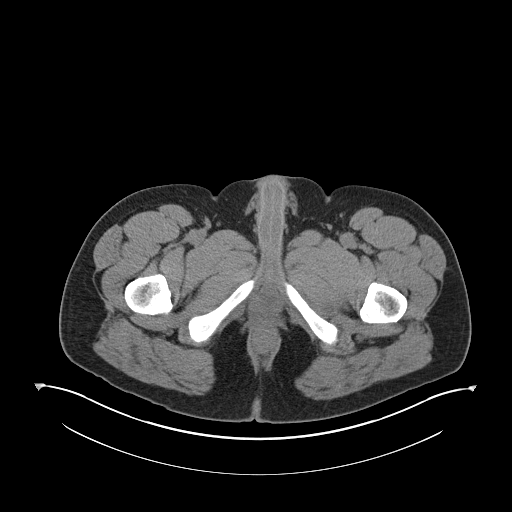
[im 24/108  soft-tissue]
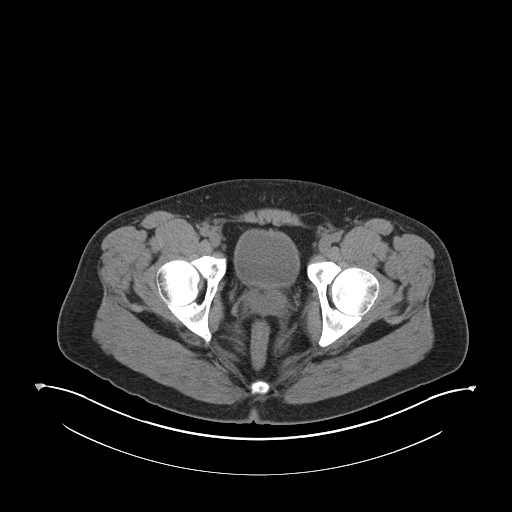
[im 30/108  soft-tissue]
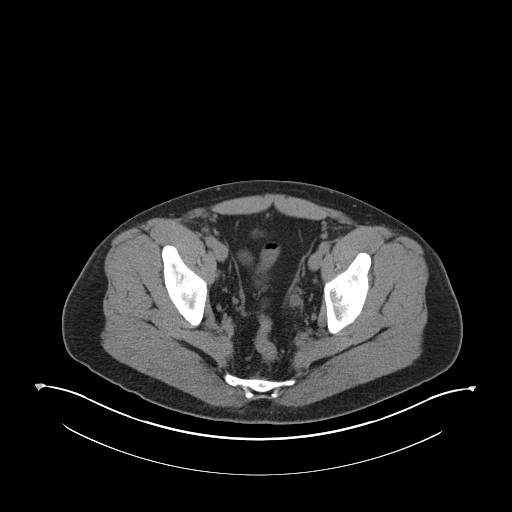
[im 36/108  soft-tissue]
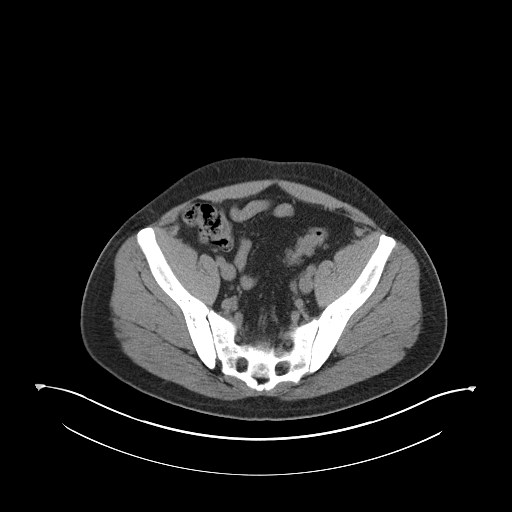
[im 48/108  soft-tissue]
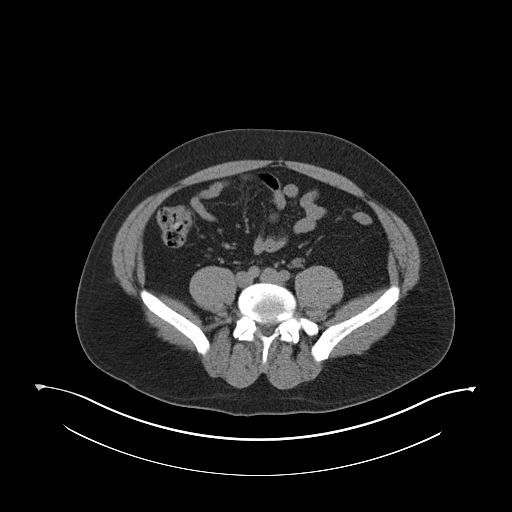
[im 54/108  soft-tissue]
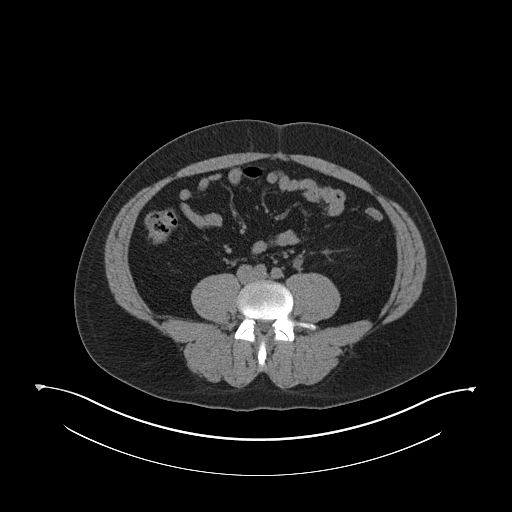
[im 60/108  soft-tissue]
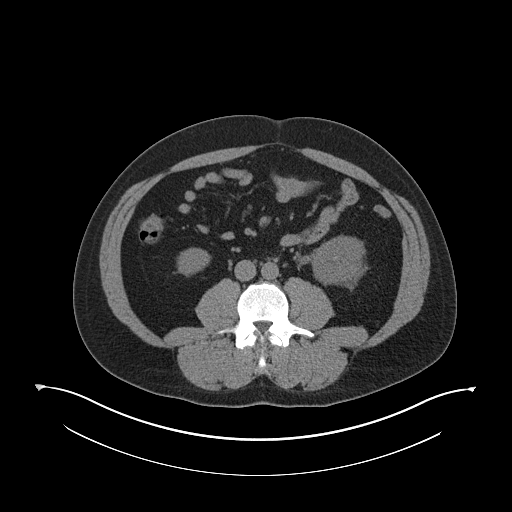
[im 72/108  soft-tissue]
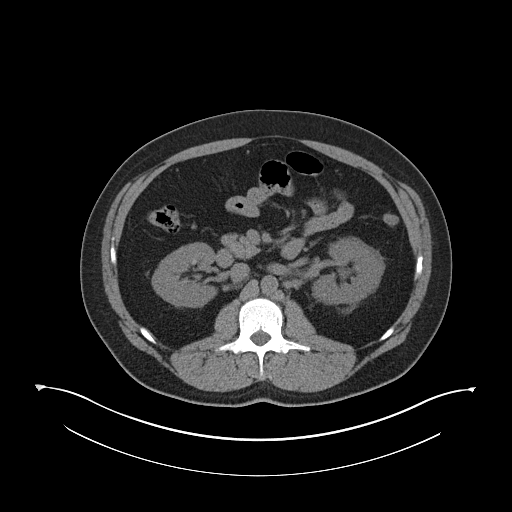
[im 72/108  bone]
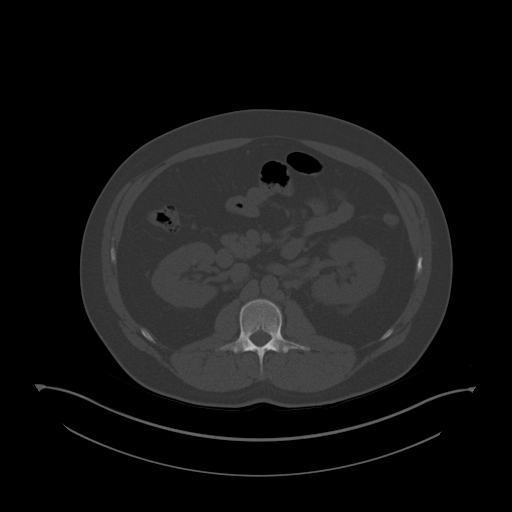
[im 78/108  soft-tissue]
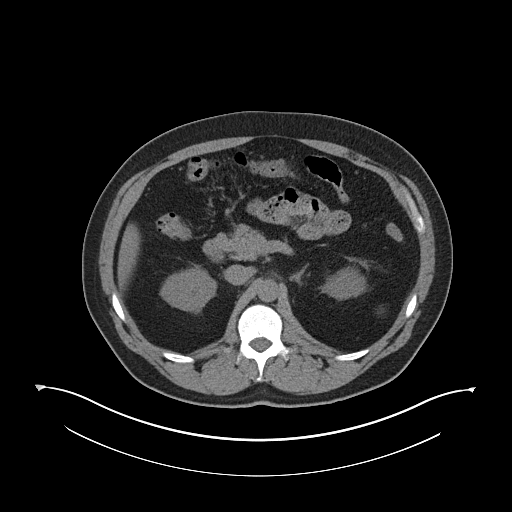
[im 84/108  soft-tissue]
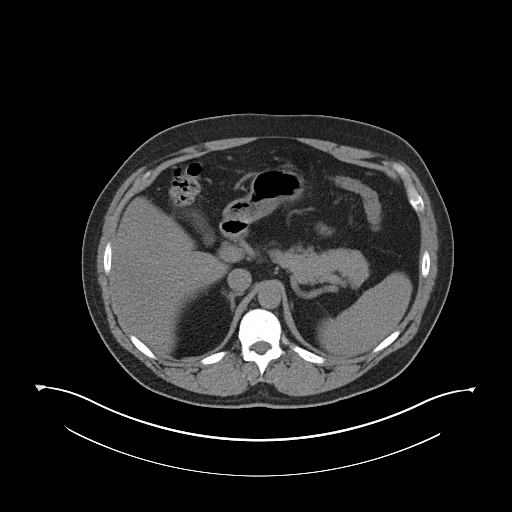
[im 96/108  soft-tissue]
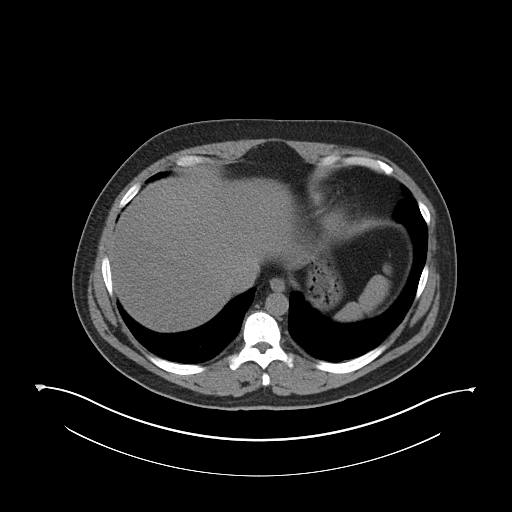
[im 102/108  soft-tissue]
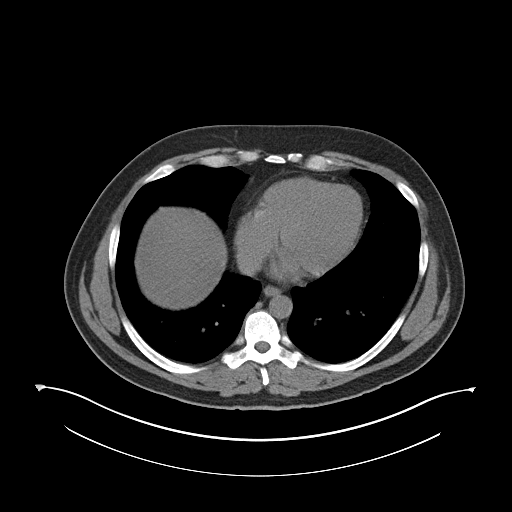

[Series 5: coronal · coronal · 0.94mm/px · 3 of 171 slices shown]
[im 57/171  soft-tissue]
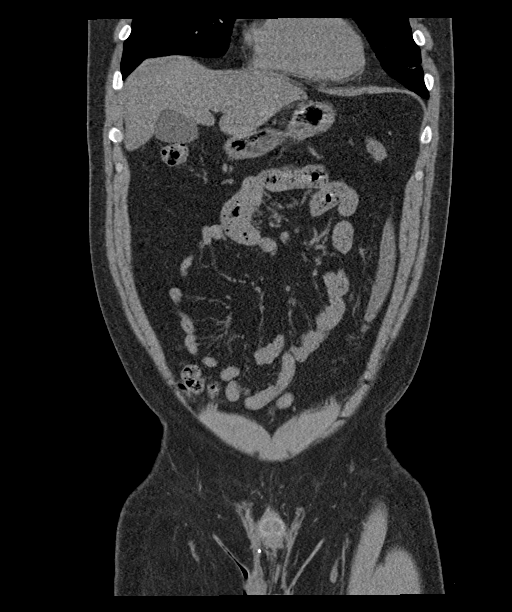
[im 76/171  soft-tissue]
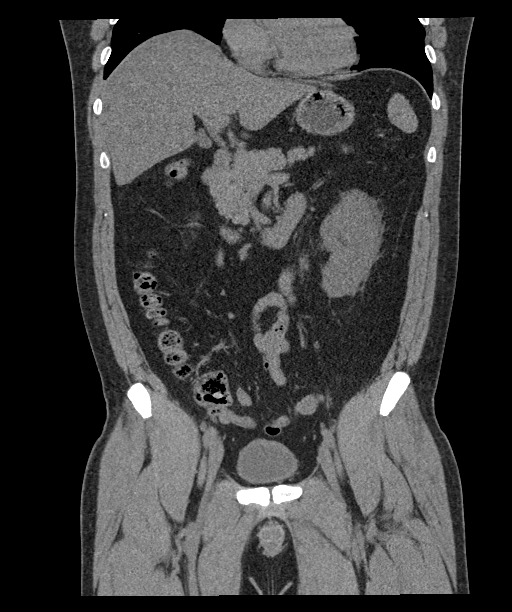
[im 95/171  soft-tissue]
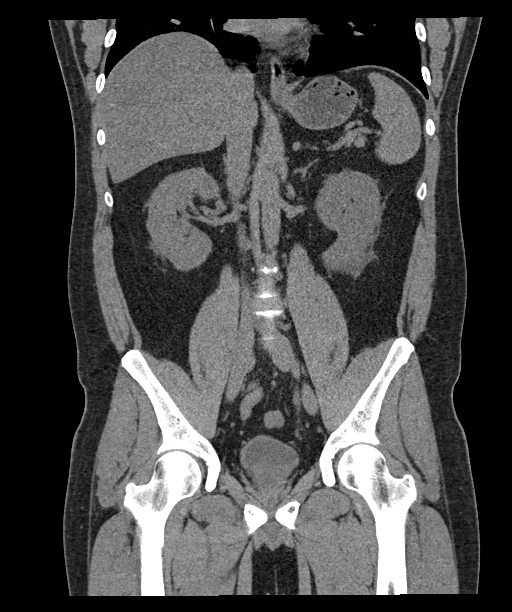

[16 of 46 positions shown; findings below may reference images not displayed]

FINDINGS: Lower chest: Linear opacities in the left base and lingula likely
reflects scar. The lung bases are otherwise clear. The imaged heart
is unremarkable.

Hepatobiliary: The liver and gallbladder are unremarkable.

Pancreas: Unremarkable.

Spleen: Unremarkable

Adrenals/Urinary Tract: The adrenals are unremarkable.

There is a 7 mm stone in the distal left ureter with mild upstream
hydroureteronephrosis, unchanged since 06/22/2021. There is mild
periureteral and perinephric stranding on the left, also similar to
the prior study. A 3 mm nonobstructing left lower pole renal stone
is also unchanged.

There are no stones in the right kidney or along the course of the
right ureter. No focal lesions are seen in either kidney. The
bladder is unremarkable.

Stomach/Bowel: The stomach is unremarkable. There is no evidence of
bowel obstruction. The appendix is unremarkable.

Vascular/Lymphatic: The abdominal aorta is nonaneurysmal. There is
no abdominal or pelvic lymphadenopathy.

Reproductive: The prostate and seminal vesicles are stable, with
unchanged prostatic calcifications.

Other: A fat containing left inguinal hernia is unchanged. There is
no ascites or free air.

Musculoskeletal: There is no acute osseous abnormality or aggressive
osseous lesion.
IMPRESSION: Since 06/22/2021:

[DATE]. Unchanged partially obstructing 7 mm stone in the distal left
ureter with mild upstream hydroureteronephrosis and
periureteral/perinephric stranding.
2. Unchanged 3 mm nonobstructing left lower pole renal calculus.

## 2022-06-01 IMAGING — DX DG ABDOMEN 1V
2 series · 2 of 2 positions shown · non-contrast
Comparison: Abdominal radiograph dated 08/18/2020 and CT dated
07/27/2021.

CLINICAL DATA: Left ureteral stone.  Preop evaluation.

EXAM:
ABDOMEN - 1 VIEW

[abdomen kub (1 of 2)]
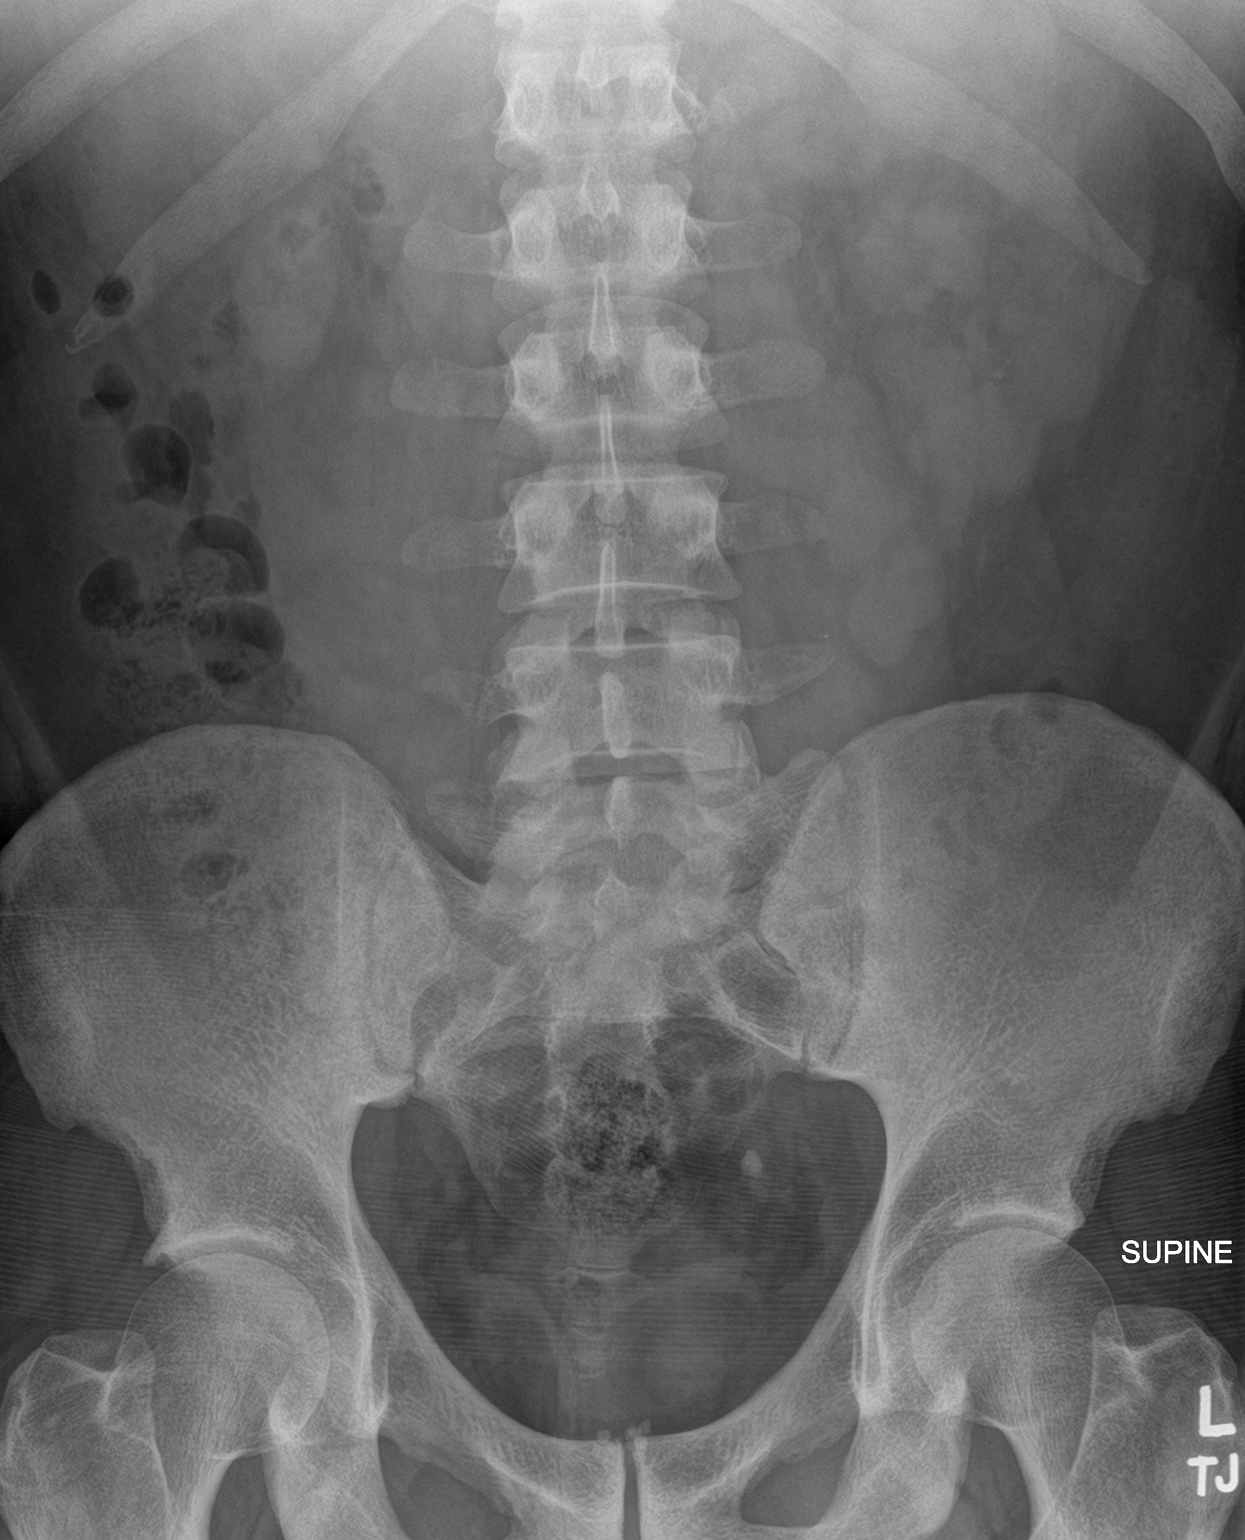

[abdomen kub (2 of 2)]
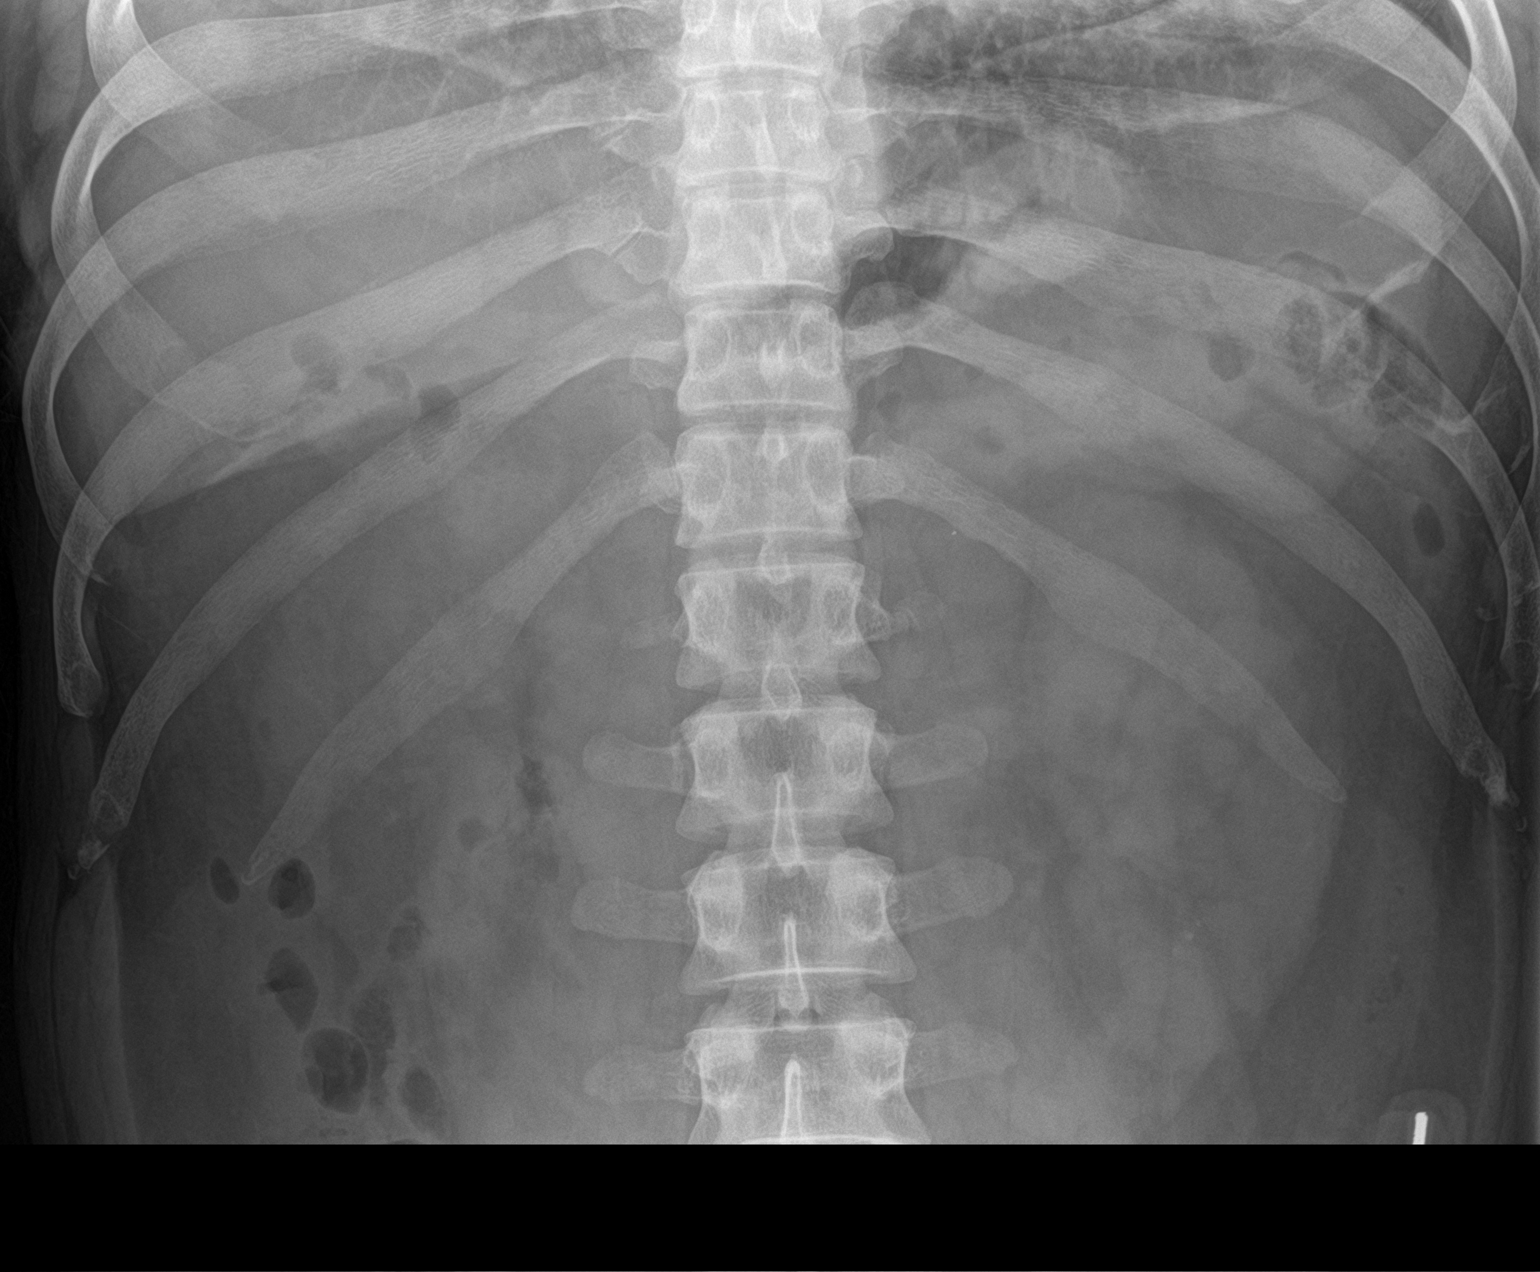

[2 of 2 positions shown; findings below may reference images not displayed]

FINDINGS: There is a 6 mm stone in the distal left ureter. Several small
stones noted over the inferior left renal silhouette measuring up to
3 mm. No stone identified over the right renal silhouette or along
the course of the right ureter. Bladder calcifications noted.

No bowel dilatation or evidence of obstruction.
IMPRESSION: A 6 mm distal left ureteral stone.

## 2024-05-07 ENCOUNTER — Ambulatory Visit: Payer: Self-pay | Admitting: Licensed Clinical Social Worker

## 2024-07-09 ENCOUNTER — Encounter: Payer: Self-pay | Admitting: Gastroenterology

## 2024-07-21 ENCOUNTER — Other Ambulatory Visit: Payer: Self-pay | Admitting: Medical Genetics

## 2024-07-30 ENCOUNTER — Other Ambulatory Visit: Payer: Self-pay

## 2024-07-30 DIAGNOSIS — Z006 Encounter for examination for normal comparison and control in clinical research program: Secondary | ICD-10-CM

## 2024-08-07 LAB — GENECONNECT MOLECULAR SCREEN: Genetic Analysis Overall Interpretation: NEGATIVE

## 2024-09-01 ENCOUNTER — Encounter: Payer: Self-pay | Admitting: Gastroenterology

## 2024-09-01 ENCOUNTER — Ambulatory Visit (INDEPENDENT_AMBULATORY_CARE_PROVIDER_SITE_OTHER): Payer: Self-pay | Admitting: Gastroenterology

## 2024-09-01 VITALS — BP 126/78 | HR 84 | Ht 69.0 in | Wt 206.2 lb

## 2024-09-01 DIAGNOSIS — K921 Melena: Secondary | ICD-10-CM | POA: Diagnosis not present

## 2024-09-01 DIAGNOSIS — K625 Hemorrhage of anus and rectum: Secondary | ICD-10-CM

## 2024-09-01 DIAGNOSIS — Z83719 Family history of colon polyps, unspecified: Secondary | ICD-10-CM

## 2024-09-01 DIAGNOSIS — R140 Abdominal distension (gaseous): Secondary | ICD-10-CM

## 2024-09-01 DIAGNOSIS — R1084 Generalized abdominal pain: Secondary | ICD-10-CM

## 2024-09-01 DIAGNOSIS — R194 Change in bowel habit: Secondary | ICD-10-CM

## 2024-09-01 DIAGNOSIS — R1013 Epigastric pain: Secondary | ICD-10-CM

## 2024-09-01 MED ORDER — NA SULFATE-K SULFATE-MG SULF 17.5-3.13-1.6 GM/177ML PO SOLN: 1.0000 | Freq: Once | ORAL | 0 refills | Status: AC

## 2024-09-01 NOTE — Progress Notes (Signed)
 Chief Complaint:altered bowel habits, abdominal pain Primary GI Doctor: Dr. San  HPI:  Patient is a  43  year old male patient with past medical history of ADHD and nephrolithiasis, who was self referred to me for a evaluation of altered bowel habits, abdominal pain .    Interval History    Patient reports he has had gastrointestinal issues over the past 19 years. He reports he was told he had IBS but has never had formal workup.  He reports his PCP has recommended colonoscopy on two occasions due to current issues.     Patient reports intermittent issues with abdominal pain, altered bowel habits, and rectal bleeding. He reports he had two episodes of upper abdominal pain he thought Jailah Willis be kidney stones and told on imaging he was constipated. He reports he has altered bowel habits diarrhea 75% of the time and constipation 25% of the time. He reports intermittent urgency especially if out eating at restaurant. He notes intermittent BRB with wiping. He does not use any OTC medications to manage it. He reports he has a hard stomach with a lot of bloating. He reports even when his weight is less his stomach always feels hard. He notes he feels pressure moves upward causing discomfort in his chest. This occurs about every 6-8 weeks. Cannot pinpoint any known triggers.    He also reports if he eats bread he has a itchy drainage in his throat. He denies symptoms of GERD or dysphagia. Patient has been prescribed Pantoprazole with no improvement. He has also tried OTC zantac and OTC Prilosec with no improvement. Denies nausea or vomiting. Appetite good.    Nonsmoker. No alcohol use.   Never had EGD/colonoscopy.   Patient's family history includes colonic polyps in mother in maternal grandfather  Wt Readings from Last 3 Encounters:  09/01/24 206 lb 3 oz (93.5 kg)  08/10/21 207 lb (93.9 kg)  06/22/21 200 lb (90.7 kg)    Past Medical History:  Diagnosis Date   ADHD    adderall   Lumbar  stenosis     Past Surgical History:  Procedure Laterality Date   EXTRACORPOREAL SHOCK WAVE LITHOTRIPSY Left 08/10/2021   Procedure: LEFT EXTRACORPOREAL SHOCK WAVE LITHOTRIPSY (ESWL);  Surgeon: Carolee Sherwood JONETTA DOUGLAS, MD;  Location: [facility];  Service: Urology;  Laterality: Left;   NO PAST SURGERIES      Current Outpatient Medications  Medication Sig Dispense Refill   buPROPion (WELLBUTRIN SR) 150 MG 12 hr tablet Take 150 mg by mouth 2 (two) times daily.     HYDROcodone -acetaminophen  (NORCO/VICODIN) 5-325 MG tablet Take 1 tablet by mouth as needed for moderate pain (pain score 4-6).     ondansetron  (ZOFRAN ) 4 MG tablet Take 1 tablet (4 mg total) by mouth every 8 (eight) hours as needed for nausea or vomiting. (Patient taking differently: Take 4 mg by mouth as needed for nausea or vomiting.) 15 tablet 0   tamsulosin  (FLOMAX ) 0.4 MG CAPS capsule Take 0.4 mg by mouth daily. (Patient taking differently: Take 0.4 mg by mouth as needed.)     valACYclovir (VALTREX) 1000 MG tablet Take 1,000 mg by mouth 2 (two) times daily. (Patient taking differently: Take 1,000 mg by mouth 2 (two) times daily as needed.)     No current facility-administered medications for this visit.    Allergies as of 09/01/2024 - Review Complete 09/01/2024  Allergen Reaction Noted   Oxycodone Hives 02/25/2017   Percocet [oxycodone-acetaminophen ] Nausea And Vomiting 02/25/2017  Family History  Problem Relation Age of Onset   Breast cancer Mother    Thyroid cancer Mother    Asthma Sister    COPD Sister    Kidney Stones Sister    Colon cancer Neg Hx    Esophageal cancer Neg Hx     Review of Systems:    Constitutional: No weight loss, fever, chills, weakness or fatigue HEENT: Eyes: No change in vision               Ears, Nose, Throat:  No change in hearing or congestion Skin: No rash or itching Cardiovascular: No chest pain, chest pressure or palpitations   Respiratory: No SOB or  cough Gastrointestinal: See HPI and otherwise negative Genitourinary: No dysuria or change in urinary frequency Neurological: No headache, dizziness or syncope Musculoskeletal: No new muscle or joint pain Hematologic: No bleeding or bruising Psychiatric: No history of depression or anxiety    Physical Exam:  Vital signs: BP 126/78   Pulse 84   Ht 5' 9 (1.753 m)   Wt 206 lb 3 oz (93.5 kg)   BMI 30.45 kg/m   Constitutional:   Pleasant male appears to be in NAD, Well developed, Well nourished, alert and cooperative Throat: Oral cavity and pharynx without inflammation, swelling or lesion.  Respiratory: Respirations even and unlabored. Lungs clear to auscultation bilaterally.   No wheezes, crackles, or rhonchi.  Cardiovascular: Normal S1, S2. Regular rate and rhythm. No peripheral edema, cyanosis or pallor.  Gastrointestinal:  Soft, nondistended, upper and lower abdominal tenderness with palpation. No rebound or guarding. Normal bowel sounds. No appreciable masses or hepatomegaly. Rectal:  Not performed.  Msk:  Symmetrical without gross deformities. Without edema, no deformity or joint abnormality.  Neurologic:  Alert and  oriented x4;  grossly normal neurologically.  Skin:   Dry and intact without significant lesions or rashes.  RELEVANT LABS AND IMAGING: CBC    Latest Ref Rng & Units 07/27/2021    9:02 AM 06/22/2021   12:28 PM 10/19/2020    1:35 AM  CBC  WBC 4.0 - 10.5 K/uL 6.9  8.2  8.9   Hemoglobin 13.0 - 17.0 g/dL 84.7  83.7  84.3   Hematocrit 39.0 - 52.0 % 46.7  49.7  48.4   Platelets 150 - 400 K/uL 229  213  195      CMP     Latest Ref Rng & Units 07/27/2021    9:02 AM 06/22/2021   12:28 PM 10/19/2020    1:35 AM  CMP  Glucose 70 - 99 mg/dL 899  98  893   BUN 6 - 20 mg/dL 13  15  16    Creatinine 0.61 - 1.24 mg/dL 9.05  9.08  9.07   Sodium 135 - 145 mmol/L 138  139  138   Potassium 3.5 - 5.1 mmol/L 3.6  4.0  3.8   Chloride 98 - 111 mmol/L 107  106  104   CO2 22 - 32  mmol/L 26  23  25    Calcium 8.9 - 10.3 mg/dL 8.7  9.0  8.6   Total Protein 6.5 - 8.1 g/dL 7.0  7.3  6.6   Total Bilirubin 0.3 - 1.2 mg/dL 0.8  0.7  0.4   Alkaline Phos 38 - 126 U/L 70  72  48   AST 15 - 41 U/L 23  22  21    ALT 0 - 44 U/L 36  40  30    Imaging: 07/2021 CT  renal stone study IMPRESSION: Since 06/22/2021:   1. Unchanged partially obstructing 7 mm stone in the distal left ureter with mild upstream hydroureteronephrosis and periureteral/perinephric stranding. 2. Unchanged 3 mm nonobstructing left lower pole renal calculus.  Assessment: Encounter Diagnoses  Name Primary?   Abdominal pain, epigastric Yes   Altered bowel habits    Bloating    Rectal bleeding    Family history of colonic polyps     43 year old male patient with chronic intermittent gastrointestinal issues including altered bowel habits and generalized abdominal pain. We discussed ordering lab work to r/o celiac disease and inflammatory disease. CT scan 9/22 showed renal stones otherwise normal exam. Recommend high fiber diet.     Will go ahead and scheduled upper GI endoscopy to evaluate bloating and epigastric pain. No improvement with PPI therapy. Rule out gastritis and/or PUD.   Will also schedule colonoscopy for altered bowel habits and blood in stool to rule out IBD and/or colitis. Hgb stable, no anemia.   Plan: -Check CRP, TTG IgA, IgA, TSH  -recommend high fiber diet - Schedule EGD in [facility] with Dr. San. The risks and benefits of EGD with possible biopsies and esophageal dilation were discussed with the patient who agrees to proceed. -Schedule for a colonoscopy in [facility] with Dr. Gifford. The risks and benefits of colonoscopy with possible polypectomy / biopsies were discussed and the patient agrees to proceed.   Thank you for the courtesy of this consult. Please call me with any questions or concerns.   Danaria Larsen, FNP-C [facility] 09/01/2024, 8:56 AM  Cc: Pa, [facility]*

## 2024-09-01 NOTE — Patient Instructions (Addendum)
 Recommend High fiber diet.  You have been scheduled for an endoscopy and colonoscopy. Please follow the written instructions given to you at your visit today.  If you use inhalers (even only as needed), please bring them with you on the day of your procedure.  DO NOT TAKE 7 DAYS PRIOR TO TEST- Trulicity (dulaglutide) Ozempic, Wegovy (semaglutide) Mounjaro (tirzepatide) Bydureon Bcise (exanatide extended release)  DO NOT TAKE 1 DAY PRIOR TO YOUR TEST Rybelsus (semaglutide) Adlyxin (lixisenatide) Victoza (liraglutide) Byetta (exanatide) ___________________________________________________________________________  _______________________________________________________  If your blood pressure at your visit was 140/90 or greater, please contact your primary care physician to follow up on this.  _______________________________________________________  If you are age 67 or older, your body mass index should be between 23-30. Your Body mass index is 30.45 kg/m. If this is out of the aforementioned range listed, please consider follow up with your Primary Care Provider.  If you are age 57 or younger, your body mass index should be between 19-25. Your Body mass index is 30.45 kg/m. If this is out of the aformentioned range listed, please consider follow up with your Primary Care Provider.   ________________________________________________________  The [facility] providers would like to encourage you to use MYCHART to communicate with providers for non-urgent requests or questions.  Due to long hold times on the telephone, sending your provider a message by [facility] may be a faster and more efficient way to get a response.  Please allow 48 business hours for a response.  Please remember that this is for non-urgent requests.  _______________________________________________________  [facility] is using a team-based approach to care.  Your team is made up of your doctor and two to  three APPS. Our APPS (Nurse Practitioners and Physician Assistants) work with your physician to ensure care continuity for you. They are fully qualified to address your health concerns and develop a treatment plan. They communicate directly with your gastroenterologist to care for you. Seeing the Advanced Practice Practitioners on your physician's team can help you by facilitating care more promptly, often allowing for earlier appointments, access to diagnostic testing, procedures, and other specialty referrals.

## 2024-09-03 NOTE — Progress Notes (Signed)
 Agree with the assessment and plan as outlined by [facility], FNP-C.  Carlitos Bottino, DO, [facility]

## 2024-09-26 ENCOUNTER — Emergency Department (HOSPITAL_BASED_OUTPATIENT_CLINIC_OR_DEPARTMENT_OTHER)

## 2024-09-26 ENCOUNTER — Other Ambulatory Visit: Payer: Self-pay

## 2024-09-26 ENCOUNTER — Ambulatory Visit
Admission: RE | Admit: 2024-09-26 | Discharge: 2024-09-26 | Disposition: A | Discharge status: Other Acute Inpatient Hospital | Source: Ambulatory Visit | Attending: Family Medicine | Admitting: Family Medicine

## 2024-09-26 ENCOUNTER — Encounter (HOSPITAL_BASED_OUTPATIENT_CLINIC_OR_DEPARTMENT_OTHER): Payer: Self-pay | Admitting: Emergency Medicine

## 2024-09-26 ENCOUNTER — Emergency Department (HOSPITAL_BASED_OUTPATIENT_CLINIC_OR_DEPARTMENT_OTHER)
Admission: EM | Admit: 2024-09-26 | Discharge: 2024-09-26 | Disposition: A | Discharge status: Home / Self Care | Attending: Emergency Medicine | Admitting: Emergency Medicine

## 2024-09-26 VITALS — BP 157/110 | HR 97 | Temp 98.5°F | Resp 20

## 2024-09-26 DIAGNOSIS — R29898 Other symptoms and signs involving the musculoskeletal system: Secondary | ICD-10-CM

## 2024-09-26 DIAGNOSIS — M5441 Lumbago with sciatica, right side: Secondary | ICD-10-CM | POA: Diagnosis not present

## 2024-09-26 DIAGNOSIS — M4800 Spinal stenosis, site unspecified: Secondary | ICD-10-CM

## 2024-09-26 DIAGNOSIS — J948 Other specified pleural conditions: Secondary | ICD-10-CM | POA: Insufficient documentation

## 2024-09-26 DIAGNOSIS — M48061 Spinal stenosis, lumbar region without neurogenic claudication: Secondary | ICD-10-CM | POA: Diagnosis not present

## 2024-09-26 DIAGNOSIS — M5136 Other intervertebral disc degeneration, lumbar region with discogenic back pain only: Secondary | ICD-10-CM | POA: Insufficient documentation

## 2024-09-26 DIAGNOSIS — M5442 Lumbago with sciatica, left side: Secondary | ICD-10-CM | POA: Diagnosis not present

## 2024-09-26 DIAGNOSIS — M545 Low back pain, unspecified: Secondary | ICD-10-CM | POA: Diagnosis present

## 2024-09-26 LAB — CBC WITH DIFFERENTIAL/PLATELET
Abs Immature Granulocytes: 0.01 K/uL (ref 0.00–0.07)
Basophils Absolute: 0.1 K/uL (ref 0.0–0.1)
Basophils Relative: 1 %
Eosinophils Absolute: 0.1 K/uL (ref 0.0–0.5)
Eosinophils Relative: 1 %
HCT: 41.4 % (ref 39.0–52.0)
Hemoglobin: 12.8 g/dL — ABNORMAL LOW (ref 13.0–17.0)
Immature Granulocytes: 0 %
Lymphocytes Relative: 34 %
Lymphs Abs: 3.5 K/uL (ref 0.7–4.0)
MCH: 22.8 pg — ABNORMAL LOW (ref 26.0–34.0)
MCHC: 30.9 g/dL (ref 30.0–36.0)
MCV: 73.8 fL — ABNORMAL LOW (ref 80.0–100.0)
Monocytes Absolute: 0.9 K/uL (ref 0.1–1.0)
Monocytes Relative: 9 %
Neutro Abs: 5.5 K/uL (ref 1.7–7.7)
Neutrophils Relative %: 55 %
Platelets: 260 K/uL (ref 150–400)
RBC: 5.61 MIL/uL (ref 4.22–5.81)
RDW: 17.4 % — ABNORMAL HIGH (ref 11.5–15.5)
WBC: 10.1 K/uL (ref 4.0–10.5)
nRBC: 0 % (ref 0.0–0.2)

## 2024-09-26 LAB — BASIC METABOLIC PANEL WITH GFR
Anion gap: 11 (ref 5–15)
BUN: 14 mg/dL (ref 6–20)
CO2: 24 mmol/L (ref 22–32)
Calcium: 9.5 mg/dL (ref 8.9–10.3)
Chloride: 105 mmol/L (ref 98–111)
Creatinine, Ser: 0.86 mg/dL (ref 0.61–1.24)
GFR, Estimated: >60 mL/min (ref >60)
Glucose, Bld: 94 mg/dL (ref 70–99)
Potassium: 3.5 mmol/L (ref 3.5–5.1)
Sodium: 141 mmol/L (ref 135–145)

## 2024-09-26 MED ORDER — METHYLPREDNISOLONE 4 MG PO TBPK: ORAL_TABLET | ORAL | 0 refills | Status: AC

## 2024-09-26 MED ORDER — FENTANYL CITRATE (PF) 50 MCG/ML IJ SOSY
50.0000 ug | PREFILLED_SYRINGE | Freq: Once | INTRAMUSCULAR | Status: AC
  Administered 2024-09-26: 50 ug via INTRAVENOUS
  Filled 2024-09-26: qty 1

## 2024-09-26 MED ORDER — CYCLOBENZAPRINE HCL 5 MG PO TABS
5.0000 mg | ORAL_TABLET | Freq: Once | ORAL | Status: AC
  Administered 2024-09-26: 5 mg via ORAL
  Filled 2024-09-26: qty 1

## 2024-09-26 MED ORDER — GADOBUTROL 1 MMOL/ML IV SOLN
9.0000 mL | Freq: Once | INTRAVENOUS | Status: AC | PRN
  Administered 2024-09-26: 9 mL via INTRAVENOUS

## 2024-09-26 MED ORDER — KETOROLAC TROMETHAMINE 10 MG PO TABS: 10.0000 mg | ORAL_TABLET | Freq: Four times a day (QID) | ORAL | 0 refills | Status: AC | PRN

## 2024-09-26 MED ORDER — KETOROLAC TROMETHAMINE 60 MG/2ML IM SOLN
30.0000 mg | Freq: Once | INTRAMUSCULAR | Status: DC
  Filled 2024-09-26: qty 2

## 2024-09-26 MED ORDER — LIDOCAINE 5 % EX PTCH
1.0000 | MEDICATED_PATCH | CUTANEOUS | Status: DC
  Administered 2024-09-26: 1 via TRANSDERMAL
  Filled 2024-09-26: qty 1

## 2024-09-26 MED ORDER — LORAZEPAM 1 MG PO TABS
1.0000 mg | ORAL_TABLET | ORAL | Status: AC | PRN
  Administered 2024-09-26: 1 mg via ORAL
  Filled 2024-09-26: qty 1

## 2024-09-26 MED ORDER — HYDROCODONE-ACETAMINOPHEN 5-325 MG PO TABS: 2.0000 | ORAL_TABLET | ORAL | 0 refills | Status: AC | PRN

## 2024-09-26 MED ORDER — KETOROLAC TROMETHAMINE 15 MG/ML IJ SOLN
15.0000 mg | Freq: Once | INTRAMUSCULAR | Status: AC
  Administered 2024-09-26: 15 mg via INTRAVENOUS
  Filled 2024-09-26: qty 1

## 2024-09-26 MED ORDER — LIDOCAINE 5 % EX PTCH: 1.0000 | MEDICATED_PATCH | CUTANEOUS | 0 refills | Status: AC

## 2024-09-26 NOTE — Discharge Instructions (Addendum)
 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  This is likely causing your pain.  There is no evidence of acute emergency requiring immediate surgical intervention.  Will discharge you on oral Toradol , lidocaine and a steroid pack.  Follow-up outpatient with neurosurgery for continued outpatient management.  Return to the emergency department for any new fall or trauma, severe back pain with fever, back pain with new weakness or numbness in the lower extremities or numbness in the groin and an area where you would sit on a saddle, back pain with urinary or fecal incontinence.  MRI Thoracic Spine: IMPRESSION:  1. No acute findings.  Normal MRI appearance of the thoracic spinal cord.  2. 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  3. T5T6 small left paracentral disc protrusion with minimal cord flattening,  without cord signal changes or significant stenosis.  4. T6T7 right paracentral disc protrusion with mild cord flattening, without  cord signal changes or significant stenosis.  5. T9T10 mild spinal stenosis due to right facet hypertrophy.    MRI Lumbar Spine: IMPRESSION:  1. No acute findings.  2. L5-S1: Right subarticular/foraminal disc protrusion with annular fissure  closely approximating the right L5 and descending S1 nerve roots. Moderate  right L5 foraminal narrowing.  3. L4-5: Broad-based posterior central to left subarticular disc protrusion  closely approximating the descending L5 nerve roots without overt impingement.  Mild to moderate bilateral L4 foraminal stenosis.  4. L3-4: Small central to left subarticular disc protrusion with mild narrowing  of the lateral recesses, left greater than right. Mild left L3 foraminal  narrowing.

## 2024-09-26 NOTE — ED Triage Notes (Signed)
 Lower back right side  I tweaked my back on Tuesday, worse on Thursday Numbness and weakness in bilateral extremities Denies incont

## 2024-09-26 NOTE — ED Triage Notes (Addendum)
 Pt c/o mid lower back painx4d. Pt states the back got worse starting Thurs. Pt states when he squats down his whole body seizes and then he falls over. Pt states legs give out on him. Pt c/o numbness and tingling in pelvis and into hips bilat. Pt denies loss of bowel or bladder. Pt states he has weakness in lower body.

## 2024-09-26 NOTE — ED Provider Notes (Addendum)
 [facility] [place] EMERGENCY DEPARTMENT AT [facility] Provider Note   CSN: [id number] Arrival date & time: 09/26/24  1550     Patient presents with: Back Pain   James Waters is a 43 y.o. male.    Back Pain    43 year old male with medical history significant for lumbar stenosis presenting to the emergency department with a chief complaint of back pain with radiculopathy.  The patient states that I tweaked my back on Tuesday and symptoms got worse on Thursday.  The patient denies any clear injury or trauma to his low back.  Denies any significant heavy lifting other than lifting his daughter.  He has had low back pain with radiculopathy down both legs, worse on the right side.  He initially went to urgent care and was sent to the emergency department for further evaluation.  He was found to have reportedly weakness in the bilateral lower extremities with diminished reflexes.  He denies any fevers or chills, denies any saddle anesthesia.  He denies any IV drug abuse history.  He denies any urinary or fecal incontinence.  Prior to Admission medications   Medication Sig Start Date End Date Taking? Authorizing Provider  HYDROcodone -acetaminophen  (NORCO/VICODIN) 5-325 MG tablet Take 2 tablets by mouth every 4 (four) hours as needed. 09/26/24  Yes Jerrol Agent, MD  ketorolac  (TORADOL ) 10 MG tablet Take 1 tablet (10 mg total) by mouth every 6 (six) hours as needed. 09/26/24  Yes Jerrol Agent, MD  lidocaine (LIDODERM) 5 % Place 1 patch onto the skin daily. Remove & Discard patch within 12 hours or as directed by MD 09/26/24  Yes Jerrol Agent, MD  methylPREDNISolone (MEDROL DOSEPAK) 4 MG TBPK tablet Take as directed on the box 09/26/24  Yes Jerrol Agent, MD  buPROPion (WELLBUTRIN SR) 150 MG 12 hr tablet Take 150 mg by mouth 2 (two) times daily. 08/21/24   [provider]  ondansetron  (ZOFRAN ) 4 MG tablet Take 1 tablet (4 mg total) by mouth every 8 (eight) hours as needed for  nausea or vomiting. Patient taking differently: Take 4 mg by mouth as needed for nausea or vomiting. 07/27/21   Curatolo, Adam, DO  tamsulosin  (FLOMAX ) 0.4 MG CAPS capsule Take 0.4 mg by mouth daily. Patient taking differently: Take 0.4 mg by mouth as needed.    [provider]  valACYclovir (VALTREX) 1000 MG tablet Take 1,000 mg by mouth 2 (two) times daily. Patient taking differently: Take 1,000 mg by mouth 2 (two) times daily as needed. 05/12/24   [provider]    Allergies: Oxycodone and Percocet [oxycodone-acetaminophen ]    Review of Systems  Musculoskeletal:  Positive for back pain.  All other systems reviewed and are negative.   Updated Vital Signs BP 136/81 (BP Location: Right Arm)   Pulse 80   Temp 98.2 F (36.8 C) (Oral)   Resp 16   SpO2 99%   Physical Exam Vitals and nursing note reviewed.  Constitutional:      General: He is not in acute distress.    Appearance: He is well-developed.  HENT:     Head: Normocephalic and atraumatic.  Eyes:     Conjunctiva/sclera: Conjunctivae normal.  Cardiovascular:     Rate and Rhythm: Normal rate and regular rhythm.     Heart sounds: No murmur heard. Pulmonary:     Effort: Pulmonary effort is normal. No respiratory distress.     Breath sounds: Normal breath sounds.  Abdominal:     Palpations: Abdomen is soft.  Tenderness: There is no abdominal tenderness.  Musculoskeletal:        General: No swelling.     Cervical back: Neck supple.     Comments: No midline tenderness of the cervical, thoracic spine, mild paraspinal tenderness bilaterally of the lower lumbar spine.  Positive straight leg raise test bilaterally  Skin:    General: Skin is warm and dry.     Capillary Refill: Capillary refill takes less than 2 seconds.  Neurological:     Mental Status: He is alert.     Comments: 5 out of 5 strength in the bilateral upper extremities.  4/5 strength in the bilateral lower extremities, 2+ prepatellar reflexes  bilaterally  Psychiatric:        Mood and Affect: Mood normal.     (all labs ordered are listed, but only abnormal results are displayed) Labs Reviewed  CBC WITH DIFFERENTIAL/PLATELET - Abnormal; Notable for the following components:      Result Value   Hemoglobin 12.8 (*)    MCV 73.8 (*)    MCH 22.8 (*)    RDW 17.4 (*)    All other components within normal limits  BASIC METABOLIC PANEL WITH GFR    EKG: None  Radiology: MR Lumbar Spine W Wo Contrast Result Date: 09/26/2024 EXAM: MR Lumbar Spine Without and With Intravenous Contrast. 09/26/2024 07:05:29 PM TECHNIQUE: Multiplanar multisequence MRI of the lumbar spine was performed without and with the administration of intravenous contrast. 9mL (gadobutrol (GADAVIST) 1 MMOL/ML injection 9 mL GADOBUTROL 1 MMOL/ML IV SOLN). COMPARISON: None available CLINICAL HISTORY: Myelopathy, acute, lumbar spine. FINDINGS: LIMITATIONS: Examination mildly limited as no axial precontrast T1 weighted sequences available for review at time of the septation. BONES AND ALIGNMENT: Normal vertebral body heights. Decreased T1 signal intensity within the visualized bone marrow, nonspecific, but most commonly related to anemia, smoking, obesity. No worrisome osseous lesions. Normal alignment. SPINAL CORD: The conus medullaris terminates at the level of L1. SOFT TISSUES: No acute abnormality. L1-L2: Unremarkable. L2-L3: Unremarkable. L3-L4: Small central to left subarticular disc protrusion and density ventral thecal sac (series 8, image 24). Mild bilateral facet hypertrophy. Resultant mild narrowing of the lateral recesses, left greater than right. Central canal remains patent. Mild left L3 foraminal narrowing. Right neural foraminal remains patent. L4-L5: Mild degenerative vertebral disc space narrowing. Disc desiccation with diffuse disc bulge. Mild reactive endplate spurring. Broad based posterior central to left subarticular disc protrusion closely approximates the  descending L5 nerve roots without overt impingement, greater on the left (series 8, image 30). Mild bilateral facet hypertrophy. No more than mild narrowing of the lateral recesses bilaterally. Central canal remains patent. Mild to moderate bilateral L4 foraminal stenosis. L5-S1: Disc desiccation with mild disc bulge. Superimposed right subarticular to foraminal disc protrusion with annular fissure (series 8, image 35). Protrusion is closely approximating both the right L5 and descending S1 nerve roots. No canal or lateral recess stenosis. Moderate right L5 foraminal narrowing. IMPRESSION: 1. No acute findings. 2. L5-S1: Right subarticular/foraminal disc protrusion with annular fissure closely approximating the right L5 and descending S1 nerve roots. Moderate right L5 foraminal narrowing. 3. L4-5: Broad-based posterior central to left subarticular disc protrusion closely approximating the descending L5 nerve roots without overt impingement. Mild to moderate bilateral L4 foraminal stenosis. 4. L3-4: Small central to left subarticular disc protrusion with mild narrowing of the lateral recesses, left greater than right. Mild left L3 foraminal narrowing. Electronically signed by: Morene Hoard MD 09/26/2024 07:39 PM EDT RP Workstation: [id number]  MR THORACIC SPINE W WO CONTRAST Result Date: 09/26/2024 EXAM: MRI THORACIC SPINE WITH AND WITHOUT INTRAVENOUS CONTRAST 09/26/2024 06:50:24 PM TECHNIQUE: Multiplanar multisequence MRI of the thoracic spine was performed with and without the administration of intravenous contrast. 9 mL (gadobutrol (GADAVIST) 1 MMOL/ML injection 9 mL GADOBUTROL 1 MMOL/ML IV SOLN). COMPARISON: None available. CLINICAL HISTORY: Myelopathy, acute, thoracic spine. FINDINGS: BONES AND ALIGNMENT: Normal alignment. Normal vertebral body heights. Decreased T1 signal intensity within the visualized bone marrow, nonspecific, but most commonly related to anemia, smoking, or obesity. No abnormal  enhancement. SPINAL CORD: Normal spinal cord volume. Normal spinal cord signal. At T5-T6, there is minimal cord flattening. At T6-T7, there is mild cord flattening. No cord signal changes are noted at T5-T6 or T6-T7. 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 This measures 2.3 x 1.5 x 2.1 cm (series 20, image 13). DEGENERATIVE CHANGES: T5-T6: Small left paracentral disc protrusion and indents the ventral thecal sac. Minimal cord flattening without cord signal changes or significant stenosis. T6-T7: Right paracentral disc protrusion with superior migration and indents the ventral thecal sac. Mild cord flattening without cord signal changes or significant spinal stenosis. T9-T10: Normal airspace. Right and left facet hypertrophy with mild spinal stenosis (series 20, image 28). IMPRESSION: 1. No acute findings.  Normal MRI appearance of the thoracic spinal cord. 2. 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 3. T5T6 small left paracentral disc protrusion with minimal cord flattening, without cord signal changes or significant stenosis. 4. T6T7 right paracentral disc protrusion with mild cord flattening, without cord signal changes or significant stenosis. 5. T9T10 mild spinal stenosis due to right facet hypertrophy. Electronically signed by: Morene Hoard MD 09/26/2024 07:18 PM EDT RP Workstation: [id number]     Procedures   Medications Ordered in the ED  lidocaine (LIDODERM) 5 % 1 patch (1 patch Transdermal Patch Applied 09/26/24 1742)  cyclobenzaprine (FLEXERIL) tablet 5 mg (5 mg Oral Given 09/26/24 1740)  ketorolac  (TORADOL ) 15 MG/ML injection 15 mg (15 mg Intravenous Given 09/26/24 1758)  LORazepam (ATIVAN) tablet 1 mg (1 mg Oral  Given 09/26/24 1748)  fentaNYL  (SUBLIMAZE ) injection 50 mcg (50 mcg Intravenous Given 09/26/24 1759)  gadobutrol (GADAVIST) 1 MMOL/ML injection 9 mL (9 mLs Intravenous Contrast Given 09/26/24 1849)                                    Medical Decision Making Amount and/or Complexity of Data Reviewed Labs: ordered. Radiology: ordered.  Risk Prescription drug management.    43 year old male with medical history significant for lumbar stenosis presenting to the emergency department with a chief complaint of back pain with radiculopathy.  The patient states that I tweaked my back on Tuesday and symptoms got worse on Thursday.  The patient denies any clear injury or trauma to his low back.  Denies any significant heavy lifting other than lifting his daughter.  He has had low back pain with radiculopathy down both legs, worse on the right side.  He initially went to urgent care and was sent to the emergency department for further evaluation.  He was found to have reportedly weakness in the bilateral lower extremities with diminished reflexes.  He denies any fevers or chills, denies any saddle anesthesia.  He denies any IV drug abuse history.  He denies any urinary or fecal incontinence.  Medical Decision Making:   James Waters is a 43 y.o. male who presented to the ED today with acute lower back pain over the past 72 hours, detailed above.      On my initial exam, the pt was with mild bilateral weakness in the lower extremities, possibly pain related,  tolerating  p.o. intake without difficulty.  Patient had no abnormal DTRs, no midline spinal tenderness.  Patient endorsing complete sensation of the perineum.  Patient without episodes of fecal or urinary incontinence.  With bilateral weakness on exam, will obtain MRI imaging to further evaluate.  Labs obtained and unremarkable.  Reviewed and confirmed nursing documentation for past medical history, family history, social history.    Initial  Assessment:   With the patient's presentation of acute back pain in the above setting, most likely diagnosis is musculoskeletal strain. Other diagnoses were considered including (but not limited to) underlying fracture, epidural hematoma, cauda equina syndrome, spinal stenosis, spinal malignancy. These are considered less likely due to history of present illness and physical exam findings.   In particular, lack of fever, substantial history of IV drug use, or substantial neurologic abnormality is less consistent with epidural abscess versus discitis or other spinal infection.   Initial Plan:  Multimodal pain control described and patient informed on safe usage.  Screening evaluation including below radiographic evaluation reviewed as per below. Patient stable for continued outpatient evaluation and management of their musculoskeletal pains.  Patient referred back to primary care provider for continued evaluation and management.   Initial Study Results:   Radiology  MRI Thoracic Spine: IMPRESSION:  1. No acute findings.  Normal MRI appearance of the thoracic spinal cord.  2. 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  3. T5T6 small left paracentral disc protrusion with minimal cord flattening,  without cord signal changes or significant stenosis.  4. T6T7 right paracentral disc protrusion with mild cord flattening, without  cord signal changes or significant stenosis.  5. T9T10 mild spinal stenosis due to right facet hypertrophy.    MRI Lumbar Spine: IMPRESSION:  1. No acute findings.  2. L5-S1: Right subarticular/foraminal disc protrusion with annular fissure  closely approximating the right L5 and descending S1 nerve roots. Moderate  right L5 foraminal narrowing.  3. L4-5: Broad-based posterior central to left subarticular disc protrusion  closely approximating  the descending L5 nerve roots without overt impingement.  Mild to moderate bilateral L4 foraminal stenosis.  4. L3-4: Small central to left subarticular disc protrusion with mild narrowing  of the lateral recesses, left greater than right. Mild left L3 foraminal  narrowing.    Disposition:   Based on the above findings, I believe patient is stable for discharge.  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  Per pulmonology, Dr. Maree, recommended outpatient follow-up for biopsy.  Will plan to discharge the patient on oral Toradol , lidocaine patches and a Medrol Dosepak.  Patient and family educated about specific return precautions for given chief complaint and symptoms.  Patient and family educated about follow-up  with PCP and neurosurgery.  Patient and family expressed understanding of return precautions and need for follow-up. Patient spoken to regarding all imaging and laboratory results and appropriate follow up for these results. All education provided in verbal and written form and time was allowed for answering of patient questions. Patient discharged.          Emergency Department Medication Summary:   Medications  lidocaine (LIDODERM) 5 % 1 patch (1 patch Transdermal Patch Applied 09/26/24 1742)  cyclobenzaprine (FLEXERIL) tablet 5 mg (5 mg Oral Given 09/26/24 1740)  ketorolac  (TORADOL ) 15 MG/ML injection 15 mg (15 mg Intravenous Given 09/26/24 1758)  LORazepam (ATIVAN) tablet 1 mg (1 mg Oral Given 09/26/24 1748)  fentaNYL  (SUBLIMAZE ) injection 50 mcg (50 mcg Intravenous Given 09/26/24 1759)  gadobutrol (GADAVIST) 1 MMOL/ML injection 9 mL (9 mLs Intravenous Contrast Given 09/26/24 1849)         Final diagnoses:  Degeneration of intervertebral disc of lumbar region with discogenic back pain  Pleural mass   Spinal stenosis, unspecified spinal region    ED Discharge Orders          Ordered    methylPREDNISolone (MEDROL DOSEPAK) 4 MG TBPK tablet        09/26/24 2002    ketorolac  (TORADOL ) 10 MG tablet  Every 6 hours PRN        09/26/24 2002    lidocaine (LIDODERM) 5 %  Every 24 hours        09/26/24 2002    HYDROcodone -acetaminophen  (NORCO/VICODIN) 5-325 MG tablet  Every 4 hours PRN        09/26/24 2036    Ambulatory referral to Pulmonology        09/26/24 2037    Ambulatory referral to Neurosurgery       Comments: Please Select To Department: [facility] for Nerve or Spine  Please select To Department: [facility] AT [place] for Cranial or Neurovascular   09/26/24 2039               Jerrol Agent, MD 09/26/24 2038    Jerrol Agent, MD 09/26/24 2039

## 2024-09-26 NOTE — ED Provider Notes (Signed)
 [facility]    CSN: [id number] Arrival date & time: 09/26/24  1413      History   Chief Complaint Chief Complaint  Patient presents with   Back Pain    HPI James Waters is a 43 y.o. male past medical history of ADHD and lumbar stenosis presents for low back pain.  Patient reports he felt a twinge in his right lower back 5 days ago after catching his daughter.  He states after that it became more persistent and then since yesterday while he was camping it has become unbearable.  He states his whole body freezes up when he goes to bend over and his legs completely give out on him and he falls over.  He does endorse some numbness and tingling that radiates down both of his legs as well as weakness of both of his legs.  No saddle paresthesia or bowel or bladder incontinence.  States he has to have help to get in and out of a car due to the severity of the pain.  No history of surgeries on his back.  He took Toradol  leftover from a kidney stone without improvement.   Back Pain   Past Medical History:  Diagnosis Date   ADHD    adderall   Lumbar stenosis     There are no active problems to display for this patient.   Past Surgical History:  Procedure Laterality Date   EXTRACORPOREAL SHOCK WAVE LITHOTRIPSY Left 08/10/2021   Procedure: LEFT EXTRACORPOREAL SHOCK WAVE LITHOTRIPSY (ESWL);  Surgeon: Carolee Sherwood JONETTA DOUGLAS, MD;  Location: [facility];  Service: Urology;  Laterality: Left;   NO PAST SURGERIES         Home Medications    Prior to Admission medications   Medication Sig Start Date End Date Taking? Authorizing Provider  buPROPion (WELLBUTRIN SR) 150 MG 12 hr tablet Take 150 mg by mouth 2 (two) times daily. 08/21/24   [provider]  HYDROcodone -acetaminophen  (NORCO/VICODIN) 5-325 MG tablet Take 1 tablet by mouth as needed for moderate pain (pain score 4-6).    [provider]  ondansetron  (ZOFRAN ) 4 MG tablet Take 1 tablet (4  mg total) by mouth every 8 (eight) hours as needed for nausea or vomiting. Patient taking differently: Take 4 mg by mouth as needed for nausea or vomiting. 07/27/21   Curatolo, Adam, DO  tamsulosin  (FLOMAX ) 0.4 MG CAPS capsule Take 0.4 mg by mouth daily. Patient taking differently: Take 0.4 mg by mouth as needed.    [provider]  valACYclovir (VALTREX) 1000 MG tablet Take 1,000 mg by mouth 2 (two) times daily. Patient taking differently: Take 1,000 mg by mouth 2 (two) times daily as needed. 05/12/24   [provider]    Family History Family History  Problem Relation Age of Onset   Breast cancer Mother    Thyroid cancer Mother    Asthma Sister    COPD Sister    Kidney Stones Sister    Colon cancer Neg Hx    Esophageal cancer Neg Hx     Social History Social History   Tobacco Use   Smoking status: Never   Smokeless tobacco: Never  Vaping Use   Vaping status: Never Used  Substance Use Topics   Alcohol use: No   Drug use: No     Allergies   Oxycodone and Percocet [oxycodone-acetaminophen ]   Review of Systems Review of Systems  Musculoskeletal:  Positive for back pain.  Physical Exam Triage Vital Signs ED Triage Vitals  Encounter Vitals Group     BP 09/26/24 1444 (!) 157/110     Girls Systolic BP Percentile --      Girls Diastolic BP Percentile --      Boys Systolic BP Percentile --      Boys Diastolic BP Percentile --      Pulse Rate 09/26/24 1444 97     Resp 09/26/24 1444 20     Temp 09/26/24 1444 98.5 F (36.9 C)     Temp Source 09/26/24 1444 Oral     SpO2 09/26/24 1444 98 %     Weight --      Height --      Head Circumference --      Peak Flow --      Pain Score 09/26/24 1442 7     Pain Loc --      Pain Education --      Exclude from Growth Chart --    No data found.  Updated Vital Signs BP (!) 157/110   Pulse 97   Temp 98.5 F (36.9 C) (Oral)   Resp 20   SpO2 98%   Visual Acuity Right Eye Distance:   Left Eye  Distance:   Bilateral Distance:    Right Eye Near:   Left Eye Near:    Bilateral Near:     Physical Exam Vitals and nursing note reviewed.  Constitutional:      General: He is not in acute distress.    Appearance: Normal appearance. He is not ill-appearing.  HENT:     Head: Normocephalic and atraumatic.  Eyes:     Pupils: Pupils are equal, round, and reactive to light.  Cardiovascular:     Rate and Rhythm: Normal rate.  Pulmonary:     Effort: Pulmonary effort is normal.  Musculoskeletal:     Lumbar back: Spasms and tenderness present. No swelling, edema, deformity, signs of trauma, lacerations or bony tenderness. Decreased range of motion. Positive right straight leg raise test and positive left straight leg raise test. No scoliosis.       Back:     Comments: Strength is 4 out of 5 on the right and 5 out of 5 on the left.  Reflexes slightly diminished on the right, maybe +1.5, but not significant otherwise +2  Skin:    General: Skin is warm and dry.  Neurological:     General: No focal deficit present.     Mental Status: He is alert and oriented to person, place, and time.  Psychiatric:        Mood and Affect: Mood normal.        Behavior: Behavior normal.      UC Treatments / Results  Labs (all labs ordered are listed, but only abnormal results are displayed) Labs Reviewed - No data to display  EKG   Radiology No results found.  Procedures Procedures (including critical care time)  Medications Ordered in UC Medications - No data to display  Initial Impression / Assessment and Plan / UC Course  I have reviewed the triage vital signs and the nursing notes.  Pertinent labs & imaging results that were available during my care of the patient were reviewed by me and considered in my medical decision making (see chart for details).     I reviewed exam and symptoms with patient.  Patient presenting with significant low back pain with reported numbness and  weakness of both  legs that give out on him.  Slightly diminished reflexes on the right and strength.  Given this advised to go to the ER to rule out cauda equina syndrome.  He is in agreement with plan will go POV with his wife to the emergency room Final Clinical Impressions(s) / UC Diagnoses   Final diagnoses:  Acute right-sided low back pain with bilateral sciatica  Weakness of both legs     Discharge Instructions      Please go to the emergency room for further evaluation of your back pain/weakness of your legs    ED Prescriptions   None    PDMP not reviewed this encounter.   Loreda Myla SAUNDERS, NP 09/26/24 [phone]

## 2024-09-26 NOTE — ED Notes (Signed)
 Patient is being discharged from the Urgent Care and sent to the Emergency Department via POV . Per Myla Bold, NP, patient is in need of higher level of care due to needing further testing. Patient is aware and verbalizes understanding of plan of care.  Vitals:   09/26/24 1444  BP: (!) 157/110  Pulse: 97  Resp: 20  Temp: 98.5 F (36.9 C)  SpO2: 98%

## 2024-09-26 NOTE — ED Notes (Signed)
 Pt transported to MRI

## 2024-09-26 NOTE — Discharge Instructions (Addendum)
 Please go to the emergency room for further evaluation of your back pain/weakness of your legs

## 2024-09-30 ENCOUNTER — Other Ambulatory Visit (HOSPITAL_COMMUNITY): Payer: Self-pay

## 2024-09-30 DIAGNOSIS — R222 Localized swelling, mass and lump, trunk: Secondary | ICD-10-CM

## 2024-09-30 NOTE — Progress Notes (Unsigned)
 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  Mod sedation  HKM       Previous Messages    ----- Message ----- From: James Waters CROME Sent: 09/30/2024   1:33 PM EST To: Waters CROME James; Taryn F Rigney, RT; Ir Proc* Subject: CT BIOPSY                                      Procedure :CT BIOPSY  Reason :Mass in chest Dx: Mass in chest [R22.2 (ICD-10-CM)]    History :MR Lumbar Spine W Wo Contrast ,MR THORACIC SPINE W WO CONTRAST  Provider:Garst, Dorn SAUNDERS, MD  Provider contact ; [phone]

## 2024-10-02 NOTE — Telephone Encounter (Addendum)
 Contacted [facility] whom advised no imaging received.Follow up call made to [facility] requesting in reference to STAT request on 11/7.  Spoke w/ Rose whom reported images sent 11/7 however will send again. Electronically signed by: Suzen GORMAN Daring, RN 10/05/2024 2:52 PM     STAT Request faxed to [facility] for images to be pushed through [facility] for continuation of care. Electronically signed by: Suzen GORMAN Daring, RN 10/02/2024 2:08 PM  ----- Message from Delon Furnace sent at 10/02/2024  1:05 PM EST ----- Regarding: FW: Images Needed from [facility] and IR are asking for these images STAT. Please let me know if there is anything I need to do to help get them. Thanks! ----- Message ----- From: Delon Furnace, CMA Sent: 10/01/2024   3:06 PM EST To: Suzen Hardie Daring, RN Subject: Images Needed from [facility]                        Can you please get the images for the Lumbar and Thoracic MRI's that were done on 09/26/2024 as well as the CT abdomen from 08/06/2021 all from [facility]   Thank you!

## 2024-10-02 NOTE — Telephone Encounter (Signed)
 I connected with James Waters, 05-18-1981, [id number] by telephone on 10/02/2024, 12:21 PM and verified that I am speaking with the correct person using two identifiers.   Location Patient: [place], [place] Provider: [place], [place]  Made patient aware of PET scan results.  IMPRESSION:  CONCLUSION: 1.  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 Differential considerations include neurogenic tumor, lymphoma, and less likely metastatic disease. 2.  No other FDG avid lesions.  Patient made aware that referral was placed for biopsy, we will review this patient at tumor board next week 10/08/2024.   Hadassah Alt, MSN, FNP-C [facility]

## 2024-10-14 ENCOUNTER — Other Ambulatory Visit

## 2024-10-14 DIAGNOSIS — R140 Abdominal distension (gaseous): Secondary | ICD-10-CM | POA: Diagnosis not present

## 2024-10-14 DIAGNOSIS — R1013 Epigastric pain: Secondary | ICD-10-CM | POA: Diagnosis not present

## 2024-10-14 DIAGNOSIS — K625 Hemorrhage of anus and rectum: Secondary | ICD-10-CM | POA: Diagnosis not present

## 2024-10-14 DIAGNOSIS — R194 Change in bowel habit: Secondary | ICD-10-CM | POA: Diagnosis not present

## 2024-10-14 DIAGNOSIS — Z83719 Family history of colon polyps, unspecified: Secondary | ICD-10-CM

## 2024-10-14 LAB — C-REACTIVE PROTEIN: CRP: <0.5 mg/dL (ref 0.5–20.0)

## 2024-10-14 LAB — TSH: TSH: 0.92 u[IU]/mL (ref 0.35–5.50)

## 2024-10-15 LAB — TISSUE TRANSGLUTAMINASE ABS,IGG,IGA
(tTG) Ab, IgA: <1 U/mL
(tTG) Ab, IgG: <1 U/mL

## 2024-10-15 LAB — IGA: Immunoglobulin A: 179 mg/dL (ref 47–310)

## 2024-10-19 ENCOUNTER — Other Ambulatory Visit: Payer: Self-pay

## 2024-10-19 MED ORDER — NA SULFATE-K SULFATE-MG SULF 17.5-3.13-1.6 GM/177ML PO SOLN
1.0000 | ORAL | 0 refills | Status: AC
Start: 2024-10-19 — End: ?

## 2024-10-20 ENCOUNTER — Encounter: Payer: Self-pay | Admitting: Gastroenterology

## 2024-10-20 ENCOUNTER — Ambulatory Visit: Admitting: Gastroenterology

## 2024-10-20 ENCOUNTER — Ambulatory Visit: Payer: Self-pay | Admitting: Gastroenterology

## 2024-10-20 ENCOUNTER — Other Ambulatory Visit (INDEPENDENT_AMBULATORY_CARE_PROVIDER_SITE_OTHER)

## 2024-10-20 ENCOUNTER — Other Ambulatory Visit: Payer: Self-pay

## 2024-10-20 VITALS — BP 119/82 | HR 69 | Temp 98.4°F | Resp 18 | Ht 69.0 in | Wt 206.0 lb

## 2024-10-20 DIAGNOSIS — R1084 Generalized abdominal pain: Secondary | ICD-10-CM

## 2024-10-20 DIAGNOSIS — K625 Hemorrhage of anus and rectum: Secondary | ICD-10-CM

## 2024-10-20 DIAGNOSIS — R1013 Epigastric pain: Secondary | ICD-10-CM

## 2024-10-20 DIAGNOSIS — K3189 Other diseases of stomach and duodenum: Secondary | ICD-10-CM

## 2024-10-20 DIAGNOSIS — R194 Change in bowel habit: Secondary | ICD-10-CM

## 2024-10-20 DIAGNOSIS — K5730 Diverticulosis of large intestine without perforation or abscess without bleeding: Secondary | ICD-10-CM | POA: Diagnosis not present

## 2024-10-20 DIAGNOSIS — K31A19 Gastric intestinal metaplasia without dysplasia, unspecified site: Secondary | ICD-10-CM | POA: Diagnosis not present

## 2024-10-20 DIAGNOSIS — K2100 Gastro-esophageal reflux disease with esophagitis, without bleeding: Secondary | ICD-10-CM | POA: Diagnosis not present

## 2024-10-20 DIAGNOSIS — K640 First degree hemorrhoids: Secondary | ICD-10-CM | POA: Diagnosis not present

## 2024-10-20 DIAGNOSIS — R197 Diarrhea, unspecified: Secondary | ICD-10-CM

## 2024-10-20 DIAGNOSIS — K317 Polyp of stomach and duodenum: Secondary | ICD-10-CM

## 2024-10-20 DIAGNOSIS — K2970 Gastritis, unspecified, without bleeding: Secondary | ICD-10-CM

## 2024-10-20 LAB — IBC + FERRITIN
Ferritin: 6.8 ng/mL — ABNORMAL LOW (ref 22.0–322.0)
Iron: 45 ug/dL (ref 42–165)
Saturation Ratios: 12.7 % — ABNORMAL LOW (ref 20.0–50.0)
TIBC: 354.2 ug/dL (ref 250.0–450.0)
Transferrin: 253 mg/dL (ref 212.0–360.0)

## 2024-10-20 MED ORDER — PANTOPRAZOLE SODIUM 40 MG PO TBEC: DELAYED_RELEASE_TABLET | ORAL | 3 refills | Status: AC

## 2024-10-20 MED ORDER — SODIUM CHLORIDE 0.9 % IV SOLN: 500.0000 mL | INTRAVENOUS | Status: AC

## 2024-10-20 NOTE — Progress Notes (Signed)
 Pt's states no medical or surgical changes since previsit or office visit.

## 2024-10-20 NOTE — Progress Notes (Unsigned)
 Report to PACU, RN, vss, BBS= Clear.

## 2024-10-20 NOTE — Progress Notes (Signed)
 Called to room to assist during endoscopic procedure.  Patient ID and intended procedure confirmed with present staff. Received instructions for my participation in the procedure from the performing physician.

## 2024-10-20 NOTE — Progress Notes (Unsigned)
 GASTROENTEROLOGY PROCEDURE H&P NOTE   Primary Care Physician: Angelia Pierce, NP    Reason for Procedure:  Change in bowel habits, abdominal pain, rectal bleeding  Plan:    EGD, colonoscopy  Patient is appropriate for endoscopic procedure(s) in the ambulatory ([facility]) setting.  The nature of the procedure, as well as the risks, benefits, and alternatives were carefully and thoroughly reviewed with the patient. Ample time for discussion and questions allowed. The patient understood, was satisfied, and agreed to proceed. I personally addressed all patient questions and concerns.     HPI: James Waters is a 43 y.o. male who presents for EGD and colonoscopy for evaluation of multiple GI symptoms, to include generalized abdominal pain, changes in bowel habits, episodic hematochezia.  Family history notable for mother and maternal grandparent with colon polyps, but no known family history of colon cancer.  CT in 05/2021 with nephrolithiasis, but otherwise normal-appearing GI tract.  Abdominal x-ray in 07/2021 with left ureteral stone, but otherwise no bowel dilation or obstruction.  No abdominal imaging since then for review.  Recent labs with mild microcytic anemia with H/H 12.8/41.4 with MCV/RDW 73.8/17.  Otherwise normal BMP, TTG, IgA, CRP, TSH.  Otherwise no significant changes in GI history since last OV on 09/01/2024.  Past Medical History:  Diagnosis Date   ADHD    adderall   Lumbar stenosis     Past Surgical History:  Procedure Laterality Date   EXTRACORPOREAL SHOCK WAVE LITHOTRIPSY Left 08/10/2021   Procedure: LEFT EXTRACORPOREAL SHOCK WAVE LITHOTRIPSY (ESWL);  Surgeon: Carolee Sherwood JONETTA DOUGLAS, MD;  Location: [facility];  Service: Urology;  Laterality: Left;   NO PAST SURGERIES      Prior to Admission medications   Medication Sig Start Date End Date Taking? Authorizing Provider  buPROPion (WELLBUTRIN SR) 150 MG 12 hr tablet Take 150 mg by mouth 2 (two) times  daily. 08/21/24  Yes [provider]  Na Sulfate-K Sulfate-Mg Sulfate concentrate (SUPREP BOWEL PREP KIT) 17.5-3.13-1.6 GM/177ML SOLN Take 1 kit (354 mLs total) by mouth as directed. 10/19/24  Yes May, Deanna J, NP  HYDROcodone -acetaminophen  (NORCO/VICODIN) 5-325 MG tablet Take 2 tablets by mouth every 4 (four) hours as needed. Patient not taking: Reported on 10/20/2024 09/26/24   Jerrol Agent, MD  ketorolac  (TORADOL ) 10 MG tablet Take 1 tablet (10 mg total) by mouth every 6 (six) hours as needed. 09/26/24   Jerrol Agent, MD  lidocaine  (LIDODERM ) 5 % Place 1 patch onto the skin daily. Remove & Discard patch within 12 hours or as directed by MD Patient not taking: Reported on 10/20/2024 09/26/24   Jerrol Agent, MD  methylPREDNISolone  (MEDROL  DOSEPAK) 4 MG TBPK tablet Take as directed on the box 09/26/24   Jerrol Agent, MD  ondansetron  (ZOFRAN ) 4 MG tablet Take 1 tablet (4 mg total) by mouth every 8 (eight) hours as needed for nausea or vomiting. Patient not taking: Reported on 10/20/2024 07/27/21   Ruthe Cornet, DO  tamsulosin  (FLOMAX ) 0.4 MG CAPS capsule Take 0.4 mg by mouth daily. Patient taking differently: Take 0.4 mg by mouth as needed.    [provider]  valACYclovir (VALTREX) 1000 MG tablet Take 1,000 mg by mouth 2 (two) times daily. Patient taking differently: Take 1,000 mg by mouth 2 (two) times daily as needed. 05/12/24   [provider]    Current Outpatient Medications  Medication Sig Dispense Refill   buPROPion (WELLBUTRIN SR) 150 MG 12 hr tablet Take 150 mg by mouth  2 (two) times daily.     Na Sulfate-K Sulfate-Mg Sulfate concentrate (SUPREP BOWEL PREP KIT) 17.5-3.13-1.6 GM/177ML SOLN Take 1 kit (354 mLs total) by mouth as directed. 324 mL 0   HYDROcodone -acetaminophen  (NORCO/VICODIN) 5-325 MG tablet Take 2 tablets by mouth every 4 (four) hours as needed. (Patient not taking: Reported on 10/20/2024) 10 tablet 0   ketorolac  (TORADOL ) 10 MG tablet Take 1  tablet (10 mg total) by mouth every 6 (six) hours as needed. 20 tablet 0   lidocaine  (LIDODERM ) 5 % Place 1 patch onto the skin daily. Remove & Discard patch within 12 hours or as directed by MD (Patient not taking: Reported on 10/20/2024) 30 patch 0   methylPREDNISolone  (MEDROL  DOSEPAK) 4 MG TBPK tablet Take as directed on the box 1 each 0   ondansetron  (ZOFRAN ) 4 MG tablet Take 1 tablet (4 mg total) by mouth every 8 (eight) hours as needed for nausea or vomiting. (Patient not taking: Reported on 10/20/2024) 15 tablet 0   tamsulosin  (FLOMAX ) 0.4 MG CAPS capsule Take 0.4 mg by mouth daily. (Patient taking differently: Take 0.4 mg by mouth as needed.)     valACYclovir (VALTREX) 1000 MG tablet Take 1,000 mg by mouth 2 (two) times daily. (Patient taking differently: Take 1,000 mg by mouth 2 (two) times daily as needed.)     Current Facility-Administered Medications  Medication Dose Route Frequency Provider Last Rate Last Admin   0.9 %  sodium chloride  infusion  500 mL Intravenous Continuous Ronak Duquette V, DO        Allergies as of 10/20/2024 - Review Complete 10/20/2024  Allergen Reaction Noted   Oxycodone Hives 02/25/2017   Percocet [oxycodone-acetaminophen ] Nausea And Vomiting 02/25/2017    Family History  Problem Relation Age of Onset   Breast cancer Mother    Thyroid  cancer Mother    Asthma Sister    COPD Sister    Kidney Stones Sister    Colon cancer Neg Hx    Esophageal cancer Neg Hx     Social History   Socioeconomic History   Marital status: Married    Spouse name: Not on file   Number of children: Not on file   Years of education: Not on file   Highest education level: Not on file  Occupational History   Not on file  Tobacco Use   Smoking status: Never   Smokeless tobacco: Never  Vaping Use   Vaping status: Never Used  Substance and Sexual Activity   Alcohol use: No   Drug use: No   Sexual activity: Not on file  Other Topics Concern   Not on file  Social  History Narrative   Not on file   Social Drivers of Health   Financial Resource Strain: Not on file  Food Insecurity: Not on file  Transportation Needs: Not on file  Physical Activity: Not on file  Stress: Not on file  Social Connections: Not on file  Intimate Partner Violence: Not on file    Physical Exam: Vital signs in last 24 hours: @BP  (!) 140/83   Pulse 78   Temp 98.4 F (36.9 C) (Temporal)   Ht 5' 9 (1.753 m)   Wt 206 lb (93.4 kg)   SpO2 98%   BMI 30.42 kg/m  GEN: NAD EYE: Sclerae anicteric ENT: MMM CV: Non-tachycardic Pulm: CTA b/l GI: Soft, NT/ND NEURO:  Alert & Oriented x 3   Sandor Flatter, DO [facility]   10/20/2024 10:33 AM;

## 2024-10-20 NOTE — Op Note (Signed)
 [facility] Patient Name: James Waters Procedure Date: 10/20/2024 11:11 AM MRN: [id number] Endoscopist: Sandor Flatter , MD, [id number] Age: 43 Referring MD:  Date of Birth: 07/31/1981 Gender: Male Account #: [id number] Procedure:                Colonoscopy Indications:              Generalized abdominal pain, Hematochezia, Change in                            bowel habits, Diarrhea, Microcytosis Medicines:                Monitored Anesthesia Care Procedure:                Pre-Anesthesia Assessment:                           - Prior to the procedure, a History and Physical                            was performed, and patient medications and                            allergies were reviewed. The patient's tolerance of                            previous anesthesia was also reviewed. The risks                            and benefits of the procedure and the sedation                            options and risks were discussed with the patient.                            All questions were answered, and informed consent                            was obtained. Prior Anticoagulants: The patient has                            taken no anticoagulant or antiplatelet agents. ASA                            Grade Assessment: II - A patient with mild systemic                            disease. After reviewing the risks and benefits,                            the patient was deemed in satisfactory condition to                            undergo the procedure.  After obtaining informed consent, the colonoscope                            was passed under direct vision. Throughout the                            procedure, the patient's blood pressure, pulse, and                            oxygen saturations were monitored continuously. The                            CF [id number] #[id number] was introduced through the anus                            and advanced  to the the terminal ileum. The                            colonoscopy was performed without difficulty. The                            patient tolerated the procedure well. The quality                            of the bowel preparation was good. The terminal                            ileum, ileocecal valve, appendiceal orifice, and                            rectum were photographed. Scope In: 11:32:52 AM Scope Out: 11:43:37 AM Scope Withdrawal Time: 0 hours 9 minutes 6 seconds  Total Procedure Duration: 0 hours 10 minutes 45 seconds  Findings:                 The perianal and digital rectal examinations were                            normal.                           Normal mucosa was found in the entire colon.                            Biopsies for histology were taken with a cold                            forceps from the right colon and left colon for                            evaluation of microscopic colitis. Estimated blood                            loss was minimal.  A few small-mouthed diverticula were found in the                            sigmoid colon.                           Non-bleeding internal hemorrhoids were found during                            retroflexion. The hemorrhoids were small and Grade                            I (internal hemorrhoids that do not prolapse).                           The terminal ileum appeared normal. Complications:            No immediate complications. Estimated Blood Loss:     Estimated blood loss was minimal. Impression:               - Normal mucosa in the entire examined colon.                            Biopsied.                           - Diverticulosis in the sigmoid colon.                           - Non-bleeding internal hemorrhoids.                           - The examined portion of the ileum was normal. Recommendation:           - Patient has a contact number available for                             emergencies. The signs and symptoms of potential                            delayed complications were discussed with the                            patient. Return to normal activities tomorrow.                            Written discharge instructions were provided to the                            patient.                           - Resume previous diet.                           - Continue present medications.                           -  Await pathology results.                           - Repeat colonoscopy in 10 years for screening                            purposes.                           - Return to GI office PRN.                           - Use fiber, for example Citrucel, Fibercon, Konsyl                            or Metamucil.                           - Check iron panel and ferritin. Sandor Flatter, MD 10/20/2024 11:54:13 AM

## 2024-10-20 NOTE — Op Note (Signed)
  [facility] Patient Name: James Waters Procedure Date: 10/20/2024 11:17 AM MRN: [id number] Endoscopist: Sandor Flatter , MD, [id number] Age: 43 Referring MD:  Date of Birth: 10-21-1981 Gender: Male Account #: [id number] Procedure:                Upper GI endoscopy Indications:              Generalized abdominal pain, Diarrhea, Change in                            bowel habits, Increased throat clearing, Chronic                            cough, Microcytosis with mild anemia Medicines:                Monitored Anesthesia Care Procedure:                Pre-Anesthesia Assessment:                           - Prior to the procedure, a History and Physical                            was performed, and patient medications and                            allergies were reviewed. The patient's tolerance of                            previous anesthesia was also reviewed. The risks                            and benefits of the procedure and the sedation                            options and risks were discussed with the patient.                            All questions were answered, and informed consent                            was obtained. Prior Anticoagulants: The patient has                            taken no anticoagulant or antiplatelet agents. ASA                            Grade Assessment: II - A patient with mild systemic                            disease. After reviewing the risks and benefits,                            the patient was deemed in satisfactory condition to  undergo the procedure.                           After obtaining informed consent, the endoscope was                            passed under direct vision. Throughout the                            procedure, the patient's blood pressure, pulse, and                            oxygen saturations were monitored continuously. The                            GIF HQ190  #[id number] was introduced through the                            mouth, and advanced to the second part of duodenum.                            The upper GI endoscopy was accomplished without                            difficulty. The patient tolerated the procedure                            well. Scope In: Scope Out: Findings:                 The upper third of the esophagus and middle third                            of the esophagus were normal.                           LA Grade A (one or more mucosal breaks less than 5                            mm, not extending between tops of 2 mucosal folds)                            esophagitis with no bleeding was found 38 cm from                            the incisors.                           Localized mild inflammation characterized by                            erythema was found in the gastric antrum. Biopsies                            were taken with a cold  forceps for Helicobacter                            pylori testing. Estimated blood loss was minimal.                           Normal mucosa was found in the gastric fundus, in                            the gastric body and at the incisura. Additional                            mucosal biopsies were taken with a cold forceps for                            Helicobacter pylori testing. Estimated blood loss                            was minimal.                           A few small sessile polyps with no bleeding and no                            stigmata of recent bleeding were found in the                            gastric fundus and in the gastric body. These                            polyps were removed with a cold biopsy forceps.                            Resection and retrieval were complete. Estimated                            blood loss was minimal.                           The examined duodenum was normal. Biopsies were                            taken with a cold  forceps for histology. Estimated                            blood loss was minimal. Complications:            No immediate complications. Estimated Blood Loss:     Estimated blood loss was minimal. Impression:               - Normal upper third of esophagus and middle third                            of esophagus.                           -  LA Grade A reflux esophagitis with no bleeding.                           - Mild, non-ulcer antral gastritis. Biopsied.                           - Normal mucosa was found in the gastric fundus, in                            the gastric body and in the incisura. Biopsied.                           - A few gastric polyps. Resected and retrieved.                           - Normal examined duodenum. Biopsied. Recommendation:           - Patient has a contact number available for                            emergencies. The signs and symptoms of potential                            delayed complications were discussed with the                            patient. Return to normal activities tomorrow.                            Written discharge instructions were provided to the                            patient.                           - Resume previous diet.                           - Continue present medications.                           - Await pathology results.                           - Use Protonix  (pantoprazole ) 40 mg PO BID for 4                            weeks to promote mucosal healing of erosive                            esophagitis and gastritis, then reduce to 40 mg                            daily for continued control of suspected GERD. Can  titrate to the lowest effective dose for reflux                            management.                           - Perform a colonoscopy today. Sandor Flatter, MD 10/20/2024 11:51:04 AM

## 2024-10-20 NOTE — Patient Instructions (Addendum)
 Resume previous diet. Continue present medications. Awaiting pathology results. Use Protonix  (pantoprazole ) 40 mg by mouth twice daily for 4 weeks then reduce to daily for continued control of suspected GERD.  Repeat colonoscopy in 10 years for screening purposes.  Return to [facility] office PRN. Use fiber, for example Citrucel, Fibercon, Konsyl or Metamucil.  Handouts provided on GERD, diverticulosis, and hemorrhoids.   YOU HAD AN ENDOSCOPIC PROCEDURE TODAY AT THE [facility]:   Refer to the procedure report that was given to you for any specific questions about what was found during the examination.  If the procedure report does not answer your questions, please call your gastroenterologist to clarify.  If you requested that your care partner not be given the details of your procedure findings, then the procedure report has been included in a sealed envelope for you to review at your convenience later.  YOU SHOULD EXPECT: Some feelings of bloating in the abdomen. Passage of more gas than usual.  Walking can help get rid of the air that was put into your GI tract during the procedure and reduce the bloating. If you had a lower endoscopy (such as a colonoscopy or flexible sigmoidoscopy) you may notice spotting of blood in your stool or on the toilet paper. If you underwent a bowel prep for your procedure, you may not have a normal bowel movement for a few days.  Please Note:  You might notice some irritation and congestion in your nose or some drainage.  This is from the oxygen used during your procedure.  There is no need for concern and it should clear up in a day or so.  SYMPTOMS TO REPORT IMMEDIATELY:  Following lower endoscopy (colonoscopy or flexible sigmoidoscopy):  Excessive amounts of blood in the stool  Significant tenderness or worsening of abdominal pains  Swelling of the abdomen that is new, acute  Fever of 100F or higher  Following upper endoscopy (EGD)  Vomiting of blood  or coffee ground material  New chest pain or pain under the shoulder blades  Painful or persistently difficult swallowing  New shortness of breath  Fever of 100F or higher  Black, tarry-looking stools  For urgent or emergent issues, a gastroenterologist can be reached at any hour by calling [phone]. Do not use MyChart messaging for urgent concerns.    DIET:  We do recommend a small meal at first, but then you may proceed to your regular diet.  Drink plenty of fluids but you should avoid alcoholic beverages for 24 hours.  ACTIVITY:  You should plan to take it easy for the rest of today and you should NOT DRIVE or use heavy machinery until tomorrow (because of the sedation medicines used during the test).    FOLLOW UP: Our staff will call the number listed on your records the next business day following your procedure.  We will call around 7:15- 8:00 am to check on you and address any questions or concerns that you may have regarding the information given to you following your procedure. If we do not reach you, we will leave a message.     If any biopsies were taken you will be contacted by phone or by letter within the next 1-3 weeks.  Please call us  at [phone] if you have not heard about the biopsies in 3 weeks.    SIGNATURES/CONFIDENTIALITY: You and/or your care partner have signed paperwork which will be entered into your electronic medical record.  These signatures attest to  the fact that that the information above on your After Visit Summary has been reviewed and is understood.  Full responsibility of the confidentiality of this discharge information lies with you and/or your care-partner.

## 2024-10-21 ENCOUNTER — Telehealth: Payer: Self-pay | Admitting: *Deleted

## 2024-10-21 NOTE — Telephone Encounter (Signed)
 Left message on f/u call

## 2024-10-23 LAB — SURGICAL PATHOLOGY

## 2024-10-26 ENCOUNTER — Ambulatory Visit: Payer: Self-pay | Admitting: Gastroenterology

## 2024-10-26 DIAGNOSIS — D509 Iron deficiency anemia, unspecified: Secondary | ICD-10-CM

## 2024-10-29 ENCOUNTER — Ambulatory Visit: Admitting: Emergency Medicine

## 2024-11-20 ENCOUNTER — Telehealth: Payer: Self-pay

## 2024-11-20 DIAGNOSIS — D509 Iron deficiency anemia, unspecified: Secondary | ICD-10-CM

## 2024-11-20 NOTE — Telephone Encounter (Signed)
 Appointment cancelled. Unable to reach patient or leave VM. Mychart message sent as well.

## 2024-11-20 NOTE — Telephone Encounter (Signed)
-----   Message from Greig Corti sent at 11/20/2024  2:03 PM EST ----- Regarding: Capsule Vito and nurses I had an issue with one of the Capsules I read today - system not capturing all the images so I think we should call this pt who is scheduled for Monday 12/29 and have him hold off on coming Monday until we sort out what the issue is - don't want him to wind up  having a less than adequate study  Let him know we will get him rescheduled asap  Elspeth - keep yourself a note to get him rescheduled  Thanks!

## 2024-11-23 ENCOUNTER — Encounter

## 2024-12-04 NOTE — Telephone Encounter (Signed)
 Rescheduled capsule with patient for 12/22/24 at 8:30 am. New instructions sent to patient & new amb ref placed.

## 2024-12-22 ENCOUNTER — Encounter

## 2025-01-13 ENCOUNTER — Encounter
